# Patient Record
Sex: Male | Born: 1945 | Race: White | Hispanic: No | Smoking: Never smoker
Health system: Southern US, Community
[De-identification: ages and names within clinical notes are randomized; demographics above are authoritative.]

## PROBLEM LIST (undated history)

## (undated) DIAGNOSIS — I10 Essential (primary) hypertension: Secondary | ICD-10-CM

## (undated) DIAGNOSIS — E7800 Pure hypercholesterolemia, unspecified: Secondary | ICD-10-CM

## (undated) DIAGNOSIS — M109 Gout, unspecified: Secondary | ICD-10-CM

## (undated) DIAGNOSIS — G4730 Sleep apnea, unspecified: Secondary | ICD-10-CM

## (undated) HISTORY — PX: HERNIA REPAIR: SHX51

## (undated) HISTORY — PX: CYSTECTOMY: SUR359

---

## 2016-07-01 ENCOUNTER — Ambulatory Visit: Payer: Self-pay | Admitting: General Surgery

## 2016-07-01 NOTE — H&P (Signed)
History of Present Illness Axel Filler MD; 07/01/2016 10:17 AM) The patient is a 71 year old male who presents with an inguinal hernia. The patient is a 71 year old male comes in today with history of a right angle hernia. Patient states this is been there for the last several months. He does state that it is reducible. It is causing him some discomfort at times. He states that he is to be going out of the country in approximately a month will like to have this repaired before leaving. He's had no previous symptoms or signs of incarceration/strangulation.  Patient denies any fever, chills, nausea, vomiting, constipation. All other reviews systems are negative.   Past Surgical History Ferd Glassing, RN; 07/01/2016 9:50 AM) Lung Surgery  Left. Vasectomy   Diagnostic Studies History Ferd Glassing, RN; 07/01/2016 9:50 AM) Colonoscopy  within last year  Allergies Ferd Glassing, RN; 07/01/2016 9:49 AM) No Known Drug Allergies 07/01/2016  Medication History Ferd Glassing, RN; 07/01/2016 9:49 AM) Allopurinol (  Tablet, Oral) Active. Efudex (5% Cream, External) Active. Enalapril Maleate (  Tablet, Oral) Active. Fenofibrate (  Tablet, Oral) Active. Aspirin (  Tablet Chewable, Oral) Active. Medications Reconciled  Social History Ferd Glassing, RN; 07/01/2016 9:50 AM) Alcohol use  Occasional alcohol use. Caffeine use  Coffee, Tea. No drug use  Tobacco use  Never smoker.  Family History Ferd Glassing, RN; 07/01/2016 9:50 AM) Diabetes Mellitus  Father. Hypertension  Mother.  Other Problems Ferd Glassing, RN; 07/01/2016 9:50 AM) High blood pressure  Hypercholesterolemia  Inguinal Hernia     Review of Systems Ferd Glassing RN; 07/01/2016 9:50 AM) General Not Present- Appetite Loss, Chills, Fatigue, Fever, Night Sweats, Weight Gain and Weight Loss. Skin Not Present- Change in Wart/Mole, Dryness, Hives, Jaundice, New Lesions, Non-Healing Wounds, Rash and  Ulcer. HEENT Present- Wears glasses/contact lenses. Not Present- Earache, Hearing Loss, Hoarseness, Nose Bleed, Oral Ulcers, Ringing in the Ears, Seasonal Allergies, Sinus Pain, Sore Throat, Visual Disturbances and Yellow Eyes. Respiratory Not Present- Bloody sputum, Chronic Cough, Difficulty Breathing, Snoring and Wheezing. Cardiovascular Not Present- Chest Pain, Difficulty Breathing Lying Down, Leg Cramps, Palpitations, Rapid Heart Rate, Shortness of Breath and Swelling of Extremities. Gastrointestinal Not Present- Abdominal Pain, Bloating, Bloody Stool, Change in Bowel Habits, Chronic diarrhea, Constipation, Difficulty Swallowing, Excessive gas, Gets full quickly at meals, Hemorrhoids, Indigestion, Nausea, Rectal Pain and Vomiting. Male Genitourinary Not Present- Blood in Urine, Change in Urinary Stream, Frequency, Impotence, Nocturia, Painful Urination, Urgency and Urine Leakage. Musculoskeletal Not Present- Back Pain, Joint Pain, Joint Stiffness, Muscle Pain, Muscle Weakness and Swelling of Extremities. Neurological Not Present- Decreased Memory, Fainting, Headaches, Numbness, Seizures, Tingling, Tremor, Trouble walking and Weakness. Psychiatric Not Present- Anxiety, Bipolar, Change in Sleep Pattern, Depression, Fearful and Frequent crying. Hematology Present- Blood Thinners. Not Present- Easy Bruising, Excessive bleeding, Gland problems, HIV and Persistent Infections.  Vitals Ferd Glassing RN; 07/01/2016 9:49 AM) 07/01/2016 9:48 AM Weight: 261.2 lb Height: 71in Body Surface Area: 2.36 m Body Mass Index: 36.43 kg/m  Temp.: 98.21F  Pulse: 87 (Regular)  BP: 130/84 (Sitting, Left Arm, Standard)       Physical Exam Axel Filler, MD; 07/01/2016 10:17 AM) General Mental Status-Alert. General Appearance-Consistent with stated age. Hydration-Well hydrated. Voice-Normal.  Head and Neck Head-normocephalic, atraumatic with no lesions or palpable  masses. Trachea-midline.  Eye Eyeball - Bilateral-Extraocular movements intact. Sclera/Conjunctiva - Bilateral-No scleral icterus.  Chest and Lung Exam Chest and lung exam reveals -quiet, even and easy respiratory effort with no use of accessory muscles. Inspection Chest Wall - Normal. Back -  normal.  Cardiovascular Cardiovascular examination reveals -normal heart sounds, regular rate and rhythm with no murmurs.  Abdomen Inspection Skin - Scar - no surgical scars. Hernias - Inguinal hernia - Right - Reducible. Palpation/Percussion Normal exam - Soft, Non Tender, No Rebound tenderness, No Rigidity (guarding) and No hepatosplenomegaly. Auscultation Normal exam - Bowel sounds normal.  Neurologic Neurologic evaluation reveals -alert and oriented x 3 with no impairment of recent or remote memory. Mental Status-Normal.  Musculoskeletal Normal Exam - Left-Upper Extremity Strength Normal and Lower Extremity Strength Normal. Normal Exam - Right-Upper Extremity Strength Normal, Lower Extremity Weakness.    Assessment & Plan Axel Filler MD; 07/01/2016 10:17 AM) RIGHT INGUINAL HERNIA (K40.90) Impression: 70 year old male with likely direct right inguinal hernia 1. Will proceed to the operating for laparoscopic right inguinal hernia repair with mesh  All risks and benefits were discussed with the patient to generally include, but not limited to: infection, bleeding, damage to surrounding structures, acute and chronic nerve pain, and recurrence. Alternatives were offered and described. All questions were answered and the patient voiced understanding of the procedure and wishes to proceed at this point with hernia repair.

## 2016-07-03 NOTE — Pre-Procedure Instructions (Signed)
Sean Cabrera  07/03/2016      [id number] [place], [place] - [address] [place] [address] Phone: [phone] Fax: [phone]    Your procedure is scheduled on Wed, May 2 @ 8:30 AM  Report to [facility] Admitting at 6:30 AM  Call this number if you have problems the morning of surgery:  [phone]   Remember:  Do not eat food or drink liquids after midnight.  Take these medicines the morning of surgery with A SIP OF WATER Allopurinol(Zyloprim)             Stop taking your Aspirin. No Goody's,BC's,Aleve,Advil,Motrin,Ibuprofen,Fish Oil,or any Herbal Medications.    Do not wear jewelry.  Do not wear lotions, powders,colognes, or deoderant.             Men may shave face and neck.  Do not bring valuables to the hospital.  [facility] is not responsible for any belongings or valuables.  Contacts, dentures or bridgework may not be worn into surgery.  Leave your suitcase in the car.  After surgery it may be brought to your room.  For patients admitted to the hospital, discharge time will be determined by your treatment team.  Patients discharged the day of surgery will not be allowed to drive home.    Special instructions:   [facility] - Preparing for Surgery  Before surgery, you can play an important role.  Because skin is not sterile, your skin needs to be as free of germs as possible.  You can reduce the number of germs on you skin by washing with CHG (chlorahexidine gluconate) soap before surgery.  CHG is an antiseptic cleaner which kills germs and bonds with the skin to continue killing germs even after washing.  Please DO NOT use if you have an allergy to CHG or antibacterial soaps.  If your skin becomes reddened/irritated stop using the CHG and inform your nurse when you arrive at Short Stay.  Do not shave (including legs and underarms) for at least 48 hours prior to the first CHG shower.  You may shave your  face.  Please follow these instructions carefully:   1.  Shower with CHG Soap the night before surgery and the                                morning of Surgery.  2.  If you choose to wash your hair, wash your hair first as usual with your       normal shampoo.  3.  After you shampoo, rinse your hair and body thoroughly to remove the                      Shampoo.  4.  Use CHG as you would any other liquid soap.  You can apply chg directly       to the skin and wash gently with scrungie or a clean washcloth.  5.  Apply the CHG Soap to your body ONLY FROM THE NECK DOWN.        Do not use on open wounds or open sores.  Avoid contact with your eyes,       ears, mouth and genitals (private parts).  Wash genitals (private parts)       with your normal soap.  6.  Wash thoroughly, paying special attention to the area where  your surgery        will be performed.  7.  Thoroughly rinse your body with warm water from the neck down.  8.  DO NOT shower/wash with your normal soap after using and rinsing off       the CHG Soap.  9.  Pat yourself dry with a clean towel.            10.  Wear clean pajamas.            11.  Place clean sheets on your bed the night of your first shower and do not        sleep with pets.  Day of Surgery  Do not apply any lotions/deoderants the morning of surgery.  Please wear clean clothes to the hospital/surgery center.

## 2016-07-06 ENCOUNTER — Encounter (HOSPITAL_COMMUNITY)
Admission: RE | Admit: 2016-07-06 | Discharge: 2016-07-06 | Disposition: A | Discharge status: Home / Self Care | Payer: Medicare Other | Source: Ambulatory Visit | Attending: General Surgery | Admitting: General Surgery

## 2016-07-06 ENCOUNTER — Encounter (HOSPITAL_COMMUNITY): Payer: Self-pay

## 2016-07-06 DIAGNOSIS — Z9852 Vasectomy status: Secondary | ICD-10-CM | POA: Diagnosis not present

## 2016-07-06 DIAGNOSIS — Z8249 Family history of ischemic heart disease and other diseases of the circulatory system: Secondary | ICD-10-CM | POA: Diagnosis not present

## 2016-07-06 DIAGNOSIS — Z6836 Body mass index (BMI) 36.0-36.9, adult: Secondary | ICD-10-CM | POA: Diagnosis not present

## 2016-07-06 DIAGNOSIS — E669 Obesity, unspecified: Secondary | ICD-10-CM | POA: Diagnosis not present

## 2016-07-06 DIAGNOSIS — E7800 Pure hypercholesterolemia, unspecified: Secondary | ICD-10-CM | POA: Diagnosis not present

## 2016-07-06 DIAGNOSIS — Z7982 Long term (current) use of aspirin: Secondary | ICD-10-CM | POA: Diagnosis not present

## 2016-07-06 DIAGNOSIS — G4730 Sleep apnea, unspecified: Secondary | ICD-10-CM | POA: Diagnosis not present

## 2016-07-06 DIAGNOSIS — Z833 Family history of diabetes mellitus: Secondary | ICD-10-CM | POA: Diagnosis not present

## 2016-07-06 DIAGNOSIS — K4090 Unilateral inguinal hernia, without obstruction or gangrene, not specified as recurrent: Secondary | ICD-10-CM | POA: Diagnosis present

## 2016-07-06 DIAGNOSIS — I10 Essential (primary) hypertension: Secondary | ICD-10-CM | POA: Diagnosis not present

## 2016-07-06 DIAGNOSIS — Z9889 Other specified postprocedural states: Secondary | ICD-10-CM | POA: Diagnosis not present

## 2016-07-06 DIAGNOSIS — Z79899 Other long term (current) drug therapy: Secondary | ICD-10-CM | POA: Diagnosis not present

## 2016-07-06 HISTORY — DX: Sleep apnea, unspecified: G47.30

## 2016-07-06 HISTORY — DX: Gout, unspecified: M10.9

## 2016-07-06 HISTORY — DX: Essential (primary) hypertension: I10

## 2016-07-06 LAB — BASIC METABOLIC PANEL
Anion gap: 7 (ref 5–15)
BUN: 21 mg/dL — AB (ref 6–20)
CALCIUM: 9 mg/dL (ref 8.9–10.3)
CO2: 23 mmol/L (ref 22–32)
CREATININE: 1.21 mg/dL (ref 0.61–1.24)
Chloride: 108 mmol/L (ref 101–111)
GFR calc Af Amer: >60 mL/min (ref >60)
GFR, EST NON AFRICAN AMERICAN: 59 mL/min — AB (ref >60)
Glucose, Bld: 138 mg/dL — ABNORMAL HIGH (ref 65–99)
POTASSIUM: 3.9 mmol/L (ref 3.5–5.1)
SODIUM: 138 mmol/L (ref 135–145)

## 2016-07-06 LAB — CBC
HCT: 40.6 % (ref 39.0–52.0)
HEMOGLOBIN: 13.5 g/dL (ref 13.0–17.0)
MCH: 30.1 pg (ref 26.0–34.0)
MCHC: 33.3 g/dL (ref 30.0–36.0)
MCV: 90.4 fL (ref 78.0–100.0)
Platelets: 256 10*3/uL (ref 150–400)
RBC: 4.49 MIL/uL (ref 4.22–5.81)
RDW: 12.5 % (ref 11.5–15.5)
WBC: 5.8 10*3/uL (ref 4.0–10.5)

## 2016-07-06 NOTE — Progress Notes (Addendum)
REQUESTED STRESS TEST, OV, ANY CARDIAC TESTS FROM DR. ZACHARY IN [place], [place].  SPOKE WITH DR. Albertina Senegal OFFICE WHO STATED PATIENT WAS SEEN IN 2010 BUT STRESS TEST WAS TO BE RESCHEDULED AND THEY DO NOT SEE ONE DONE.  [phone]  ATTEMPTED TO CALL PCP DR. PAUL SETTLE X2 - UNABLE TO REACH. PHONE KEPT RINGING AND STATES MAILBOX IS FULL.  [phone]

## 2016-07-07 MED ORDER — DEXTROSE 5 % IV SOLN
3.0000 g | INTRAVENOUS | Status: AC
  Administered 2016-07-08: 3 g via INTRAVENOUS
  Filled 2016-07-07: qty 3000

## 2016-07-08 ENCOUNTER — Ambulatory Visit (HOSPITAL_COMMUNITY): Payer: Medicare Other | Admitting: Certified Registered Nurse Anesthetist

## 2016-07-08 ENCOUNTER — Ambulatory Visit (HOSPITAL_COMMUNITY)
Admission: RE | Admit: 2016-07-08 | Discharge: 2016-07-08 | Disposition: A | Discharge status: Home / Self Care | Payer: Medicare Other | Source: Ambulatory Visit | Attending: General Surgery | Admitting: General Surgery

## 2016-07-08 ENCOUNTER — Encounter (HOSPITAL_COMMUNITY)
Admission: RE | Disposition: A | Discharge status: Home / Self Care | Payer: Self-pay | Source: Ambulatory Visit | Attending: General Surgery

## 2016-07-08 DIAGNOSIS — K4090 Unilateral inguinal hernia, without obstruction or gangrene, not specified as recurrent: Secondary | ICD-10-CM | POA: Insufficient documentation

## 2016-07-08 DIAGNOSIS — I10 Essential (primary) hypertension: Secondary | ICD-10-CM | POA: Diagnosis not present

## 2016-07-08 DIAGNOSIS — Z8249 Family history of ischemic heart disease and other diseases of the circulatory system: Secondary | ICD-10-CM | POA: Insufficient documentation

## 2016-07-08 DIAGNOSIS — Z79899 Other long term (current) drug therapy: Secondary | ICD-10-CM | POA: Insufficient documentation

## 2016-07-08 DIAGNOSIS — E669 Obesity, unspecified: Secondary | ICD-10-CM | POA: Insufficient documentation

## 2016-07-08 DIAGNOSIS — G4730 Sleep apnea, unspecified: Secondary | ICD-10-CM | POA: Diagnosis not present

## 2016-07-08 DIAGNOSIS — Z9852 Vasectomy status: Secondary | ICD-10-CM | POA: Insufficient documentation

## 2016-07-08 DIAGNOSIS — Z833 Family history of diabetes mellitus: Secondary | ICD-10-CM | POA: Insufficient documentation

## 2016-07-08 DIAGNOSIS — Z7982 Long term (current) use of aspirin: Secondary | ICD-10-CM | POA: Insufficient documentation

## 2016-07-08 DIAGNOSIS — E7800 Pure hypercholesterolemia, unspecified: Secondary | ICD-10-CM | POA: Diagnosis not present

## 2016-07-08 DIAGNOSIS — Z6836 Body mass index (BMI) 36.0-36.9, adult: Secondary | ICD-10-CM | POA: Insufficient documentation

## 2016-07-08 DIAGNOSIS — Z9889 Other specified postprocedural states: Secondary | ICD-10-CM | POA: Insufficient documentation

## 2016-07-08 HISTORY — PX: INSERTION OF MESH: SHX5868

## 2016-07-08 HISTORY — PX: INGUINAL HERNIA REPAIR: SHX194

## 2016-07-08 SURGERY — REPAIR, HERNIA, INGUINAL, LAPAROSCOPIC
Anesthesia: General | Site: Inguinal | Laterality: Right

## 2016-07-08 MED ORDER — MIDAZOLAM HCL 2 MG/2ML IJ SOLN
INTRAMUSCULAR | Status: AC
  Filled 2016-07-08: qty 2

## 2016-07-08 MED ORDER — BUPIVACAINE HCL 0.25 % IJ SOLN
INTRAMUSCULAR | Status: DC | PRN
  Administered 2016-07-08: 4 mL

## 2016-07-08 MED ORDER — OXYCODONE HCL 5 MG PO TABS
ORAL_TABLET | ORAL | Status: AC
  Filled 2016-07-08: qty 2

## 2016-07-08 MED ORDER — PROPOFOL 10 MG/ML IV BOLUS
INTRAVENOUS | Status: AC
  Filled 2016-07-08: qty 20

## 2016-07-08 MED ORDER — ACETAMINOPHEN 650 MG RE SUPP: 650.0000 mg | RECTAL | Status: DC | PRN

## 2016-07-08 MED ORDER — ONDANSETRON HCL 4 MG/2ML IJ SOLN
INTRAMUSCULAR | Status: DC | PRN
  Administered 2016-07-08: 4 mg via INTRAVENOUS

## 2016-07-08 MED ORDER — MORPHINE SULFATE (PF) 2 MG/ML IV SOLN: 2.0000 mg | INTRAVENOUS | Status: DC | PRN

## 2016-07-08 MED ORDER — ONDANSETRON HCL 4 MG/2ML IJ SOLN: 4.0000 mg | Freq: Once | INTRAMUSCULAR | Status: DC | PRN

## 2016-07-08 MED ORDER — OXYCODONE-ACETAMINOPHEN 5-325 MG PO TABS: 1.0000 | ORAL_TABLET | ORAL | 0 refills | Status: DC | PRN

## 2016-07-08 MED ORDER — LIDOCAINE HCL (CARDIAC) 20 MG/ML IV SOLN
INTRAVENOUS | Status: DC | PRN
  Administered 2016-07-08: 100 mg via INTRAVENOUS

## 2016-07-08 MED ORDER — PROPOFOL 10 MG/ML IV BOLUS
INTRAVENOUS | Status: DC | PRN
  Administered 2016-07-08: 150 mg via INTRAVENOUS

## 2016-07-08 MED ORDER — LACTATED RINGERS IV SOLN
INTRAVENOUS | Status: DC | PRN
  Administered 2016-07-08: 08:00:00 via INTRAVENOUS

## 2016-07-08 MED ORDER — FENTANYL CITRATE (PF) 100 MCG/2ML IJ SOLN
25.0000 ug | INTRAMUSCULAR | Status: DC | PRN
  Administered 2016-07-08 (×2): 50 ug via INTRAVENOUS

## 2016-07-08 MED ORDER — SODIUM CHLORIDE 0.9% FLUSH: 3.0000 mL | Freq: Two times a day (BID) | INTRAVENOUS | Status: DC

## 2016-07-08 MED ORDER — FENTANYL CITRATE (PF) 100 MCG/2ML IJ SOLN
INTRAMUSCULAR | Status: AC
  Administered 2016-07-08: 50 ug via INTRAVENOUS
  Filled 2016-07-08: qty 2

## 2016-07-08 MED ORDER — CHLORHEXIDINE GLUCONATE CLOTH 2 % EX PADS: 6.0000 | MEDICATED_PAD | Freq: Once | CUTANEOUS | Status: DC

## 2016-07-08 MED ORDER — ACETAMINOPHEN 325 MG PO TABS
ORAL_TABLET | ORAL | Status: DC
Start: 2016-07-08 — End: 2016-07-08
  Filled 2016-07-08: qty 2

## 2016-07-08 MED ORDER — SUGAMMADEX SODIUM 500 MG/5ML IV SOLN
INTRAVENOUS | Status: DC | PRN
  Administered 2016-07-08: 250 mg via INTRAVENOUS

## 2016-07-08 MED ORDER — 0.9 % SODIUM CHLORIDE (POUR BTL) OPTIME
TOPICAL | Status: DC | PRN
  Administered 2016-07-08: 1000 mL

## 2016-07-08 MED ORDER — FENTANYL CITRATE (PF) 250 MCG/5ML IJ SOLN
INTRAMUSCULAR | Status: AC
  Filled 2016-07-08: qty 5

## 2016-07-08 MED ORDER — ACETAMINOPHEN 325 MG PO TABS
650.0000 mg | ORAL_TABLET | ORAL | Status: DC | PRN
  Administered 2016-07-08: 650 mg via ORAL

## 2016-07-08 MED ORDER — SODIUM CHLORIDE 0.9 % IV SOLN: 250.0000 mL | INTRAVENOUS | Status: DC | PRN

## 2016-07-08 MED ORDER — BUPIVACAINE HCL (PF) 0.25 % IJ SOLN
INTRAMUSCULAR | Status: AC
  Filled 2016-07-08: qty 30

## 2016-07-08 MED ORDER — OXYCODONE HCL 5 MG PO TABS
5.0000 mg | ORAL_TABLET | ORAL | Status: DC | PRN
  Administered 2016-07-08: 10 mg via ORAL

## 2016-07-08 MED ORDER — FENTANYL CITRATE (PF) 100 MCG/2ML IJ SOLN
INTRAMUSCULAR | Status: DC | PRN
  Administered 2016-07-08 (×4): 50 ug via INTRAVENOUS
  Administered 2016-07-08: 100 ug via INTRAVENOUS
  Administered 2016-07-08: 50 ug via INTRAVENOUS

## 2016-07-08 MED ORDER — ROCURONIUM BROMIDE 100 MG/10ML IV SOLN
INTRAVENOUS | Status: DC | PRN
  Administered 2016-07-08: 50 mg via INTRAVENOUS
  Administered 2016-07-08: 10 mg via INTRAVENOUS

## 2016-07-08 MED ORDER — SODIUM CHLORIDE 0.9% FLUSH: 3.0000 mL | INTRAVENOUS | Status: DC | PRN

## 2016-07-08 SURGICAL SUPPLY — 41 items
APPLIER CLIP 5 13 M/L LIGAMAX5 (MISCELLANEOUS)
BENZOIN TINCTURE PRP APPL 2/3 (GAUZE/BANDAGES/DRESSINGS) ×3 IMPLANT
CANISTER SUCT 3000ML PPV (MISCELLANEOUS) IMPLANT
CHLORAPREP W/TINT 26ML (MISCELLANEOUS) ×3 IMPLANT
CLIP APPLIE 5 13 M/L LIGAMAX5 (MISCELLANEOUS) IMPLANT
CLOSURE WOUND 1/2 X4 (GAUZE/BANDAGES/DRESSINGS) ×1
COVER SURGICAL LIGHT HANDLE (MISCELLANEOUS) ×3 IMPLANT
ENDOLOOP SUT PDS II  0 18 (SUTURE) ×2
ENDOLOOP SUT PDS II 0 18 (SUTURE) ×1 IMPLANT
GAUZE SPONGE 2X2 8PLY STRL LF (GAUZE/BANDAGES/DRESSINGS) ×1 IMPLANT
GLOVE BIO SURGEON STRL SZ7.5 (GLOVE) ×3 IMPLANT
GLOVE BIOGEL PI IND STRL 8 (GLOVE) ×1 IMPLANT
GLOVE BIOGEL PI INDICATOR 8 (GLOVE) ×2
GLOVE SURG SS PI 8.0 STRL IVOR (GLOVE) ×6 IMPLANT
GOWN STRL REUS W/ TWL LRG LVL3 (GOWN DISPOSABLE) ×2 IMPLANT
GOWN STRL REUS W/ TWL XL LVL3 (GOWN DISPOSABLE) ×1 IMPLANT
GOWN STRL REUS W/TWL LRG LVL3 (GOWN DISPOSABLE) ×4
GOWN STRL REUS W/TWL XL LVL3 (GOWN DISPOSABLE) ×2
KIT BASIN OR (CUSTOM PROCEDURE TRAY) ×3 IMPLANT
KIT ROOM TURNOVER OR (KITS) ×3 IMPLANT
MESH 3DMAX 5X7 RT XLRG (Mesh General) ×3 IMPLANT
NEEDLE INSUFFLATION 14GA 120MM (NEEDLE) ×3 IMPLANT
NS IRRIG 1000ML POUR BTL (IV SOLUTION) IMPLANT
PAD ARMBOARD 7.5X6 YLW CONV (MISCELLANEOUS) ×6 IMPLANT
RELOAD STAPLE HERNIA 4.0 BLUE (INSTRUMENTS) ×3 IMPLANT
RELOAD STAPLE HERNIA 4.8 BLK (STAPLE) ×3 IMPLANT
SCISSORS LAP 5X35 DISP (ENDOMECHANICALS) ×3 IMPLANT
SET TROCAR LAP APPLE-HUNT 5MM (ENDOMECHANICALS) ×3 IMPLANT
SPONGE GAUZE 2X2 STER 10/PKG (GAUZE/BANDAGES/DRESSINGS) ×2
STAPLER HERNIA 12 8.5 360D (INSTRUMENTS) ×3 IMPLANT
STRIP CLOSURE SKIN 1/2X4 (GAUZE/BANDAGES/DRESSINGS) ×2 IMPLANT
SUT MNCRL AB 4-0 PS2 18 (SUTURE) ×3 IMPLANT
SUT VIC AB 1 CT1 27 (SUTURE)
SUT VIC AB 1 CT1 27XBRD ANBCTR (SUTURE) IMPLANT
SYRINGE TOOMEY DISP (SYRINGE) ×3 IMPLANT
TOWEL OR 17X24 6PK STRL BLUE (TOWEL DISPOSABLE) ×3 IMPLANT
TRAY FOLEY CATH SILVER 14FR (SET/KITS/TRAYS/PACK) ×3 IMPLANT
TRAY LAPAROSCOPIC MC (CUSTOM PROCEDURE TRAY) ×3 IMPLANT
TROCAR XCEL 12X100 BLDLESS (ENDOMECHANICALS) ×3 IMPLANT
TUBING INSUFFLATION (TUBING) ×3 IMPLANT
WATER STERILE IRR 1000ML POUR (IV SOLUTION) IMPLANT

## 2016-07-08 NOTE — Anesthesia Procedure Notes (Signed)
Procedure Name: Intubation Date/Time: 07/08/2016 8:40 AM Performed by: Shirlyn Goltz Pre-anesthesia Checklist: Patient identified, Emergency Drugs available, Suction available and Patient being monitored Patient Re-evaluated:Patient Re-evaluated prior to inductionOxygen Delivery Method: Circle system utilized Preoxygenation: Pre-oxygenation with 100% oxygen Intubation Type: IV induction Ventilation: Mask ventilation without difficulty Laryngoscope Size: Mac and 4 Grade View: Grade III Tube type: Oral Tube size: 7.0 mm Number of attempts: 2 (x1 with MAC3, x1 with MAC4) Airway Equipment and Method: Stylet Placement Confirmation: ETT inserted through vocal cords under direct vision,  positive ETCO2 and breath sounds checked- equal and bilateral Secured at: 24 cm Tube secured with: Tape Dental Injury: Teeth and Oropharynx as per pre-operative assessment

## 2016-07-08 NOTE — Interval H&P Note (Signed)
History and Physical Interval Note:  07/08/2016 7:41 AM  Sean Cabrera  has presented today for surgery, with the diagnosis of Right inguinal hernia  The various methods of treatment have been discussed with the patient and family. After consideration of risks, benefits and other options for treatment, the patient has consented to  Procedure(s): LAPAROSCOPIC RIGHT INGUINAL HERNIA REPAIR WITH MESH (Right) INSERTION OF MESH (Right) as a surgical intervention .  The patient's history has been reviewed, patient examined, no change in status, stable for surgery.  I have reviewed the patient's chart and labs.  Questions were answered to the patient's satisfaction.     Marigene Ehlers., Jed Limerick

## 2016-07-08 NOTE — Anesthesia Preprocedure Evaluation (Addendum)
Anesthesia Evaluation  Patient identified by MRN, date of birth, ID band Patient awake    Reviewed: Allergy & Precautions, NPO status , Patient's Chart, lab work & pertinent test results  Airway Mallampati: III  TM Distance: >3 FB Neck ROM: Full    Dental  (+) Teeth Intact, Dental Advisory Given   Pulmonary sleep apnea ,    Pulmonary exam normal breath sounds clear to auscultation       Cardiovascular hypertension, Pt. on medications (-) angina(-) CAD, (-) Past MI and (-) CHF Normal cardiovascular exam Rhythm:Regular Rate:Normal     Neuro/Psych negative neurological ROS     GI/Hepatic negative GI ROS, Neg liver ROS,   Endo/Other  Obesity   Renal/GU negative Renal ROS     Musculoskeletal negative musculoskeletal ROS (+)   Abdominal   Peds  Hematology negative hematology ROS (+)   Anesthesia Other Findings Day of surgery medications reviewed with the patient.  Reproductive/Obstetrics                            Anesthesia Physical Anesthesia Plan  ASA: II  Anesthesia Plan: General   Post-op Pain Management:    Induction: Intravenous  Airway Management Planned: Oral ETT  Additional Equipment:   Intra-op Plan:   Post-operative Plan: Extubation in OR  Informed Consent: I have reviewed the patients History and Physical, chart, labs and discussed the procedure including the risks, benefits and alternatives for the proposed anesthesia with the patient or authorized representative who has indicated his/her understanding and acceptance.   Dental advisory given  Plan Discussed with: CRNA  Anesthesia Plan Comments: (Risks/benefits of general anesthesia discussed with patient including risk of damage to teeth, lips, gum, and tongue, nausea/vomiting, allergic reactions to medications, and the possibility of heart attack, stroke and death.  All patient questions answered.  Patient wishes to  proceed.)        Anesthesia Quick Evaluation

## 2016-07-08 NOTE — Anesthesia Postprocedure Evaluation (Signed)
Anesthesia Post Note  Patient: Sean Cabrera  Procedure(s) Performed: Procedure(s) (LRB): LAPAROSCOPIC RIGHT INGUINAL HERNIA REPAIR WITH MESH (Right) INSERTION OF MESH (Right)  Patient location during evaluation: PACU Anesthesia Type: General Level of consciousness: awake and alert Pain management: pain level controlled Vital Signs Assessment: post-procedure vital signs reviewed and stable Respiratory status: spontaneous breathing, nonlabored ventilation, respiratory function stable and patient connected to nasal cannula oxygen Cardiovascular status: blood pressure returned to baseline and stable Postop Assessment: no signs of nausea or vomiting Anesthetic complications: no       Last Vitals:  Vitals:   07/08/16 1015 07/08/16 1030  BP: 138/80 126/79  Pulse: 76 78  Resp: 14 17  Temp:      Last Pain:  Vitals:   07/08/16 0643  TempSrc: Oral                 Cecile Hearing

## 2016-07-08 NOTE — Transfer of Care (Signed)
Immediate Anesthesia Transfer of Care Note  Patient: Sean Cabrera  Procedure(s) Performed: Procedure(s): LAPAROSCOPIC RIGHT INGUINAL HERNIA REPAIR WITH MESH (Right) INSERTION OF MESH (Right)  Patient Location: PACU  Anesthesia Type:General  Level of Consciousness: awake, alert , oriented and patient cooperative  Airway & Oxygen Therapy: Patient Spontanous Breathing and Patient connected to face mask oxygen  Post-op Assessment: Report given to RN and Post -op Vital signs reviewed and stable  Post vital signs: Reviewed and stable  Last Vitals:  Vitals:   07/08/16 0643  BP: 139/69  Pulse: 76  Resp: 20  Temp: 36.8 C    Last Pain:  Vitals:   07/08/16 0643  TempSrc: Oral         Complications: No apparent anesthesia complications

## 2016-07-08 NOTE — Op Note (Signed)
07/08/2016  9:32 AM  PATIENT:  Sean Cabrera  71 y.o. male  PRE-OPERATIVE DIAGNOSIS:  Right inguinal hernia  POST-OPERATIVE DIAGNOSIS:  Large Right DIRECT inguinal hernia  PROCEDURE:  Procedure(s): LAPAROSCOPIC RIGHT INGUINAL HERNIA REPAIR WITH MESH (Right) INSERTION OF MESH (Right)  SURGEON:  Surgeon(s) and Role:    * Axel Filler, MD - Primary  ANESTHESIA:   local and general  EBL:  5cc  BLOOD ADMINISTERED:none  DRAINS: none   LOCAL MEDICATIONS USED:  BUPIVICAINE   SPECIMEN:  No Specimen  DISPOSITION OF SPECIMEN:  N/A  COUNTS:  YES  TOURNIQUET:  * No tourniquets in log *  DICTATION: .Dragon Dictation   Counts: reported as correct x 2  Findings:  The patient had a large right direct hernia  Indications for procedure:  The patient is a 68 -year-old male with a right hernia for several months. Patient complained of symptomatology to his right inguinal area. The patient was taken back for elective inguinal hernia repair.  Details of the procedure: The patient was taken back to the operating room. The patient was placed in supine position with bilateral SCDs in place.  The patient was prepped and draped in the usual sterile fashion.  After appropriate anitbiotics were confirmed, a time-out was confirmed and all facts were verified.  0.25% Marcaine was used to infiltrate the umbilical area. A 11-blade was used to cut down the skin and blunt dissection was used to get the anterior fashion.  The anterior fascia was incised approximately 1 cm and the muscles were retracted laterally. Blunt dissection was then used to create a space in the preperitoneal area. At this time a 10 mm camera was then introduced into the space and advanced the pubic tubercle and a 12 mm trocar was placed over this and insufflation was started.  At this time and space was created from medial to laterally the preperitoneal space.  Cooper's ligament was initially cleaned off.  The hernia sac was  identified in the direct space. Dissection of the incarcerated fat was undertaken and the transversalis fascia retracted spontaneously.  At this time the spermatic cord was dissected away from the surrounding tissues.  There was no indirect component.  The peritoneuam was dissected back. There was a small tear into the hernia sac, this was ligated with a ENdoloop. The transversalis fascia was  Tacked to Cooper's ligament with 4.8 staples.  Once the peritoneum was taken down to approximately the umbilicus a Bard 3D Max mesh, size: Barney Drain, was  introduced into the preperitoneal space.  The mesh was brought over to cover the direct and indirect hernia spaces.  This was anchored into place and secured to Cooper's ligament with 4.77mm staples from a Coviden hernia stapler. It was anchored to the anterior abdominal wall with 4.8 mm staples. The hernia sac was seen lying posterior to the mesh. There was no staples placed laterally. The insufflation was evacuated and the peritoneum was seen posterior to the mesh. The trochars were removed. The anterior fascia was reapproximated using #1 Vicryl on a UR- 6.  Intra-abdominal air was evacuated and the Veress needle removed. The skin was reapproximated using 4-0 Monocryl subcuticular fashion the patient was awakened from general anesthesia and taken to recovery in stable condition.   PLAN OF CARE: Discharge to home after PACU  PATIENT DISPOSITION:  PACU - hemodynamically stable.   Delay start of Pharmacological VTE agent (>24hrs) due to surgical blood loss or risk of bleeding: not applicable

## 2016-07-08 NOTE — H&P (View-Only) (Signed)
History of Present Illness Axel Filler MD; 07/01/2016 10:17 AM) The patient is a 71 year old male who presents with an inguinal hernia. The patient is a 71 year old male comes in today with history of a right angle hernia. Patient states this is been there for the last several months. He does state that it is reducible. It is causing him some discomfort at times. He states that he is to be going out of the country in approximately a month will like to have this repaired before leaving. He's had no previous symptoms or signs of incarceration/strangulation.  Patient denies any fever, chills, nausea, vomiting, constipation. All other reviews systems are negative.   Past Surgical History Ferd Glassing, RN; 07/01/2016 9:50 AM) Lung Surgery  Left. Vasectomy   Diagnostic Studies History Ferd Glassing, RN; 07/01/2016 9:50 AM) Colonoscopy  within last year  Allergies Ferd Glassing, RN; 07/01/2016 9:49 AM) No Known Drug Allergies 07/01/2016  Medication History Ferd Glassing, RN; 07/01/2016 9:49 AM) Allopurinol (  Tablet, Oral) Active. Efudex (5% Cream, External) Active. Enalapril Maleate (  Tablet, Oral) Active. Fenofibrate (  Tablet, Oral) Active. Aspirin (  Tablet Chewable, Oral) Active. Medications Reconciled  Social History Ferd Glassing, RN; 07/01/2016 9:50 AM) Alcohol use  Occasional alcohol use. Caffeine use  Coffee, Tea. No drug use  Tobacco use  Never smoker.  Family History Ferd Glassing, RN; 07/01/2016 9:50 AM) Diabetes Mellitus  Father. Hypertension  Mother.  Other Problems Ferd Glassing, RN; 07/01/2016 9:50 AM) High blood pressure  Hypercholesterolemia  Inguinal Hernia     Review of Systems Ferd Glassing RN; 07/01/2016 9:50 AM) General Not Present- Appetite Loss, Chills, Fatigue, Fever, Night Sweats, Weight Gain and Weight Loss. Skin Not Present- Change in Wart/Mole, Dryness, Hives, Jaundice, New Lesions, Non-Healing Wounds, Rash and  Ulcer. HEENT Present- Wears glasses/contact lenses. Not Present- Earache, Hearing Loss, Hoarseness, Nose Bleed, Oral Ulcers, Ringing in the Ears, Seasonal Allergies, Sinus Pain, Sore Throat, Visual Disturbances and Yellow Eyes. Respiratory Not Present- Bloody sputum, Chronic Cough, Difficulty Breathing, Snoring and Wheezing. Cardiovascular Not Present- Chest Pain, Difficulty Breathing Lying Down, Leg Cramps, Palpitations, Rapid Heart Rate, Shortness of Breath and Swelling of Extremities. Gastrointestinal Not Present- Abdominal Pain, Bloating, Bloody Stool, Change in Bowel Habits, Chronic diarrhea, Constipation, Difficulty Swallowing, Excessive gas, Gets full quickly at meals, Hemorrhoids, Indigestion, Nausea, Rectal Pain and Vomiting. Male Genitourinary Not Present- Blood in Urine, Change in Urinary Stream, Frequency, Impotence, Nocturia, Painful Urination, Urgency and Urine Leakage. Musculoskeletal Not Present- Back Pain, Joint Pain, Joint Stiffness, Muscle Pain, Muscle Weakness and Swelling of Extremities. Neurological Not Present- Decreased Memory, Fainting, Headaches, Numbness, Seizures, Tingling, Tremor, Trouble walking and Weakness. Psychiatric Not Present- Anxiety, Bipolar, Change in Sleep Pattern, Depression, Fearful and Frequent crying. Hematology Present- Blood Thinners. Not Present- Easy Bruising, Excessive bleeding, Gland problems, HIV and Persistent Infections.  Vitals Ferd Glassing RN; 07/01/2016 9:49 AM) 07/01/2016 9:48 AM Weight: 261.2 lb Height: 71in Body Surface Area: 2.36 m Body Mass Index: 36.43 kg/m  Temp.: 98.21F  Pulse: 87 (Regular)  BP: 130/84 (Sitting, Left Arm, Standard)       Physical Exam Axel Filler, MD; 07/01/2016 10:17 AM) General Mental Status-Alert. General Appearance-Consistent with stated age. Hydration-Well hydrated. Voice-Normal.  Head and Neck Head-normocephalic, atraumatic with no lesions or palpable  masses. Trachea-midline.  Eye Eyeball - Bilateral-Extraocular movements intact. Sclera/Conjunctiva - Bilateral-No scleral icterus.  Chest and Lung Exam Chest and lung exam reveals -quiet, even and easy respiratory effort with no use of accessory muscles. Inspection Chest Wall - Normal. Back -  normal.  Cardiovascular Cardiovascular examination reveals -normal heart sounds, regular rate and rhythm with no murmurs.  Abdomen Inspection Skin - Scar - no surgical scars. Hernias - Inguinal hernia - Right - Reducible. Palpation/Percussion Normal exam - Soft, Non Tender, No Rebound tenderness, No Rigidity (guarding) and No hepatosplenomegaly. Auscultation Normal exam - Bowel sounds normal.  Neurologic Neurologic evaluation reveals -alert and oriented x 3 with no impairment of recent or remote memory. Mental Status-Normal.  Musculoskeletal Normal Exam - Left-Upper Extremity Strength Normal and Lower Extremity Strength Normal. Normal Exam - Right-Upper Extremity Strength Normal, Lower Extremity Weakness.    Assessment & Plan (Renato Spellman MD; 07/01/2016 10:17 AM) RIGHT INGUINAL HERNIA (K40.90) Impression: 71-year-old male with likely direct right inguinal hernia 1. Will proceed to the operating for laparoscopic right inguinal hernia repair with mesh  All risks and benefits were discussed with the patient to generally include, but not limited to: infection, bleeding, damage to surrounding structures, acute and chronic nerve pain, and recurrence. Alternatives were offered and described. All questions were answered and the patient voiced understanding of the procedure and wishes to proceed at this point with hernia repair. 

## 2016-07-08 NOTE — Discharge Instructions (Signed)
[facility] _______[facility] °INGUINAL HERNIA REPAIR: POST OP INSTRUCTIONS ° °Always review your discharge instruction sheet given to you by the facility where your surgery was performed. °IF YOU HAVE DISABILITY OR FAMILY LEAVE FORMS, YOU MUST BRING THEM TO THE OFFICE FOR PROCESSING.   °DO NOT GIVE THEM TO YOUR DOCTOR. ° °1. A  prescription for pain medication may be given to you upon discharge.  Take your pain medication as prescribed, if needed.  If narcotic pain medicine is not needed, then you may take acetaminophen (Tylenol) or ibuprofen (Advil) as needed. °2. Take your usually prescribed medications unless otherwise directed. °If you need a refill on your pain medication, please contact your pharmacy.  They will contact our office to request authorization. Prescriptions will not be filled after 5 pm or on week-ends. °3. You should follow a light diet the first 24 hours after arrival home, such as soup and crackers, etc.  Be sure to include lots of fluids daily.  Resume your normal diet the day after surgery. °4.Most patients will experience some swelling and bruising around the umbilicus or in the groin and scrotum.  Ice packs and reclining will help.  Swelling and bruising can take several days to resolve.  °6. It is common to experience some constipation if taking pain medication after surgery.  Increasing fluid intake and taking a stool softener (such as Colace) will usually help or prevent this problem from occurring.  A mild laxative (Milk of Magnesia or Miralax) should be taken according to package directions if there are no bowel movements after 48 hours. °7. Unless discharge instructions indicate otherwise, you may remove your bandages 24-48 hours after surgery, and you may shower at that time.  You may have steri-strips (small skin tapes) in place directly over the incision.  These strips should be left on the skin for 7-10 days.  If your surgeon used skin glue on the incision, you may  shower in 24 hours.  The glue will flake off over the next 2-3 weeks.  Any sutures or staples will be removed at the office during your follow-up visit. °8. ACTIVITIES:  You may resume regular (light) daily activities beginning the next day--such as daily self-care, walking, climbing stairs--gradually increasing activities as tolerated.  You may have sexual intercourse when it is comfortable.  Refrain from any heavy lifting or straining until approved by your doctor. ° °a.You may drive when you are no longer taking prescription pain medication, you can comfortably wear a seatbelt, and you can safely maneuver your car and apply brakes. °b.RETURN TO WORK:   °_____________________________________________ ° °9.You should see your doctor in the office for a follow-up appointment approximately 2-3 weeks after your surgery.  Make sure that you call for this appointment within a day or two after you arrive home to insure a convenient appointment time. °10.OTHER INSTRUCTIONS: _________________________ °   _____________________________________ ° °WHEN TO CALL YOUR DOCTOR: °1. Fever over 101.0 °2. Inability to urinate °3. Nausea and/or vomiting °4. Extreme swelling or bruising °5. Continued bleeding from incision. °6. Increased pain, redness, or drainage from the incision ° °The clinic staff is available to answer your questions during regular business hours.  Please don’t hesitate to call and ask to speak to one of the nurses for clinical concerns.  If you have a medical emergency, go to the nearest emergency room or call 911.  A surgeon from [facility] is always on call at the hospital ° ° °[address], [address], [place], [place]  [address] ? ° [address] [address], [place], [place]   [address] °[phone] ? [phone] ? FAX [phone] °Web site: [email or web address] ° °

## 2016-07-09 ENCOUNTER — Encounter (HOSPITAL_COMMUNITY): Payer: Self-pay | Admitting: General Surgery

## 2016-12-07 LAB — HM COLONOSCOPY

## 2017-01-21 ENCOUNTER — Other Ambulatory Visit: Payer: Self-pay | Admitting: General Surgery

## 2017-01-21 DIAGNOSIS — K4090 Unilateral inguinal hernia, without obstruction or gangrene, not specified as recurrent: Secondary | ICD-10-CM

## 2017-01-26 ENCOUNTER — Ambulatory Visit
Admission: RE | Admit: 2017-01-26 | Discharge: 2017-01-26 | Disposition: A | Discharge status: Home / Self Care | Payer: Medicare Other | Source: Ambulatory Visit | Attending: General Surgery | Admitting: General Surgery

## 2017-01-26 DIAGNOSIS — K4090 Unilateral inguinal hernia, without obstruction or gangrene, not specified as recurrent: Secondary | ICD-10-CM

## 2018-11-09 DIAGNOSIS — Z85828 Personal history of other malignant neoplasm of skin: Secondary | ICD-10-CM | POA: Insufficient documentation

## 2019-01-05 IMAGING — US US PELVIS LIMITED
1 series · 5 of 5 positions shown · non-contrast
Comparison: None in PACs

CLINICAL DATA: Right inguinal and leg pain for the past 2 months.
History of right inguinal hernia surgical in July 2016.

EXAM:
LIMITED ULTRASOUND OF PELVIS
TECHNIQUE: Limited transabdominal ultrasound examination of the pelvis was
performed.

[Series 1: us pelvis limited · 0.11mm/px · 5 of 5 slices shown]
[im 1/5]
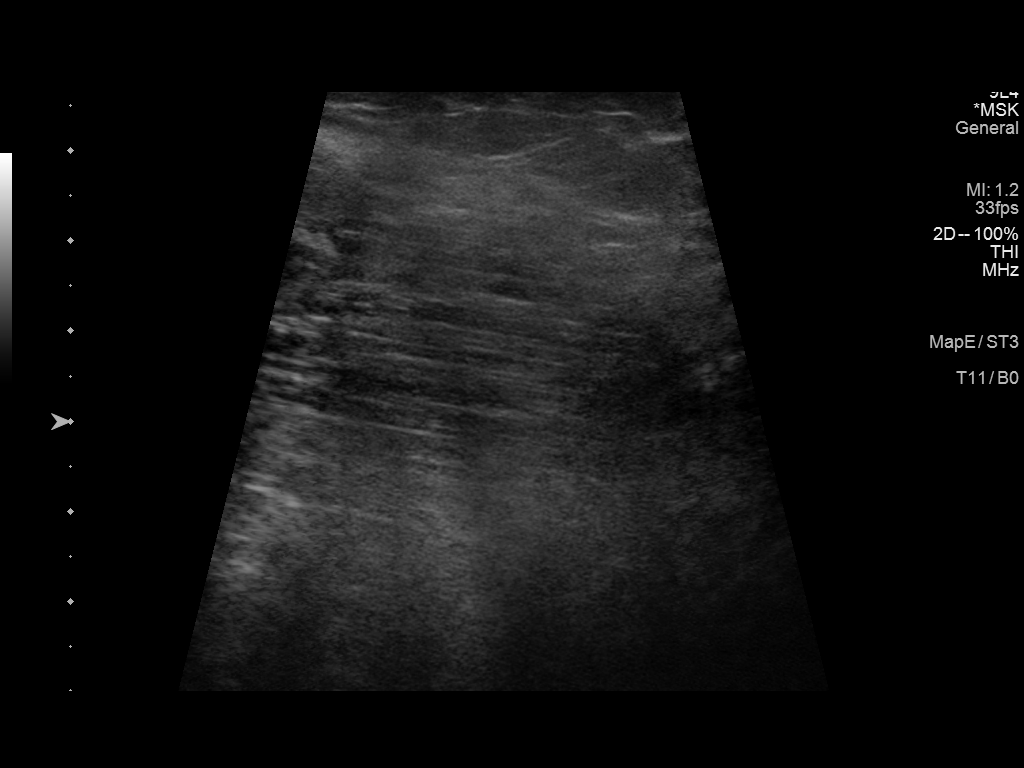
[im 2/5]
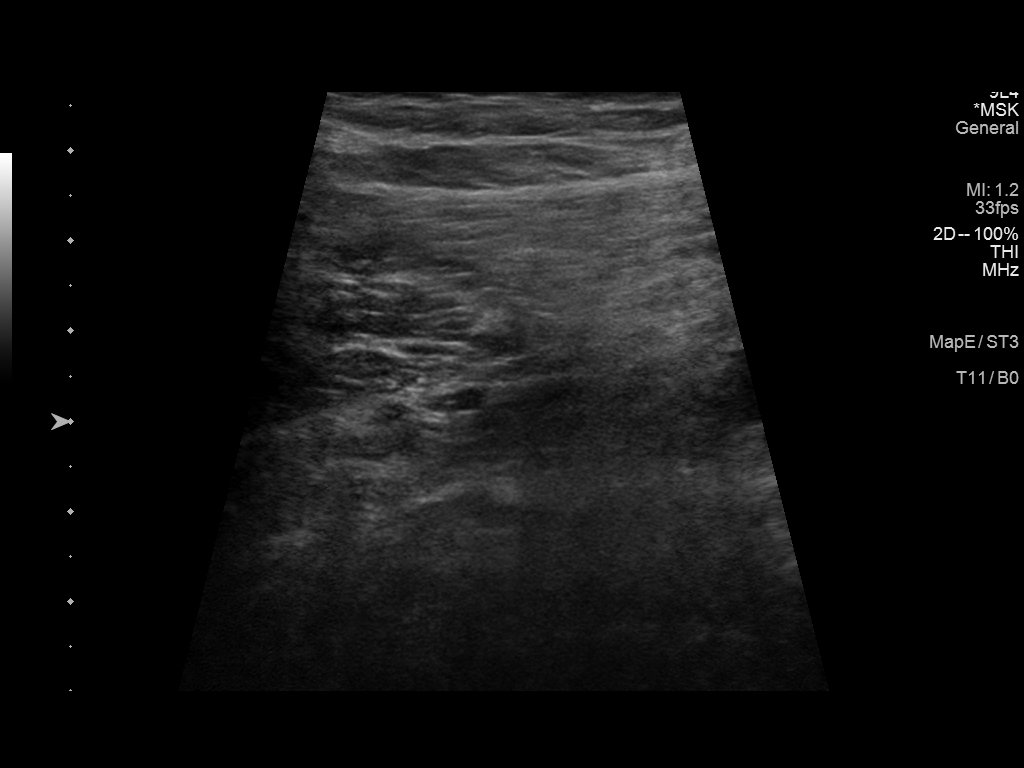
[im 3/5]
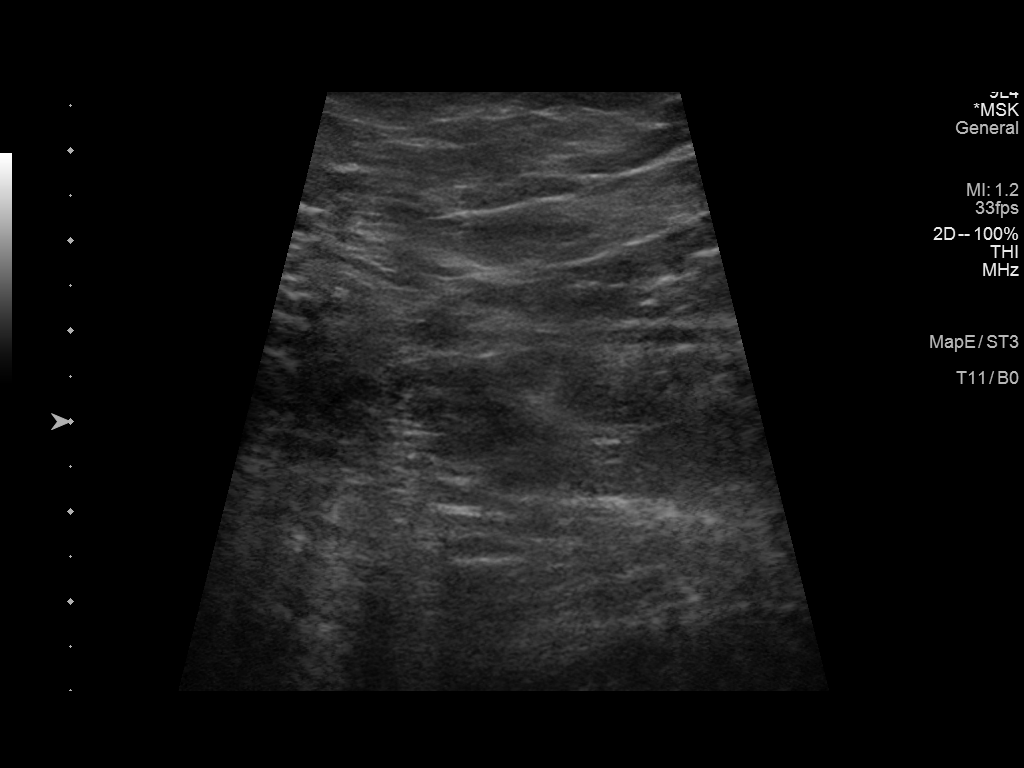
[im 4/5]
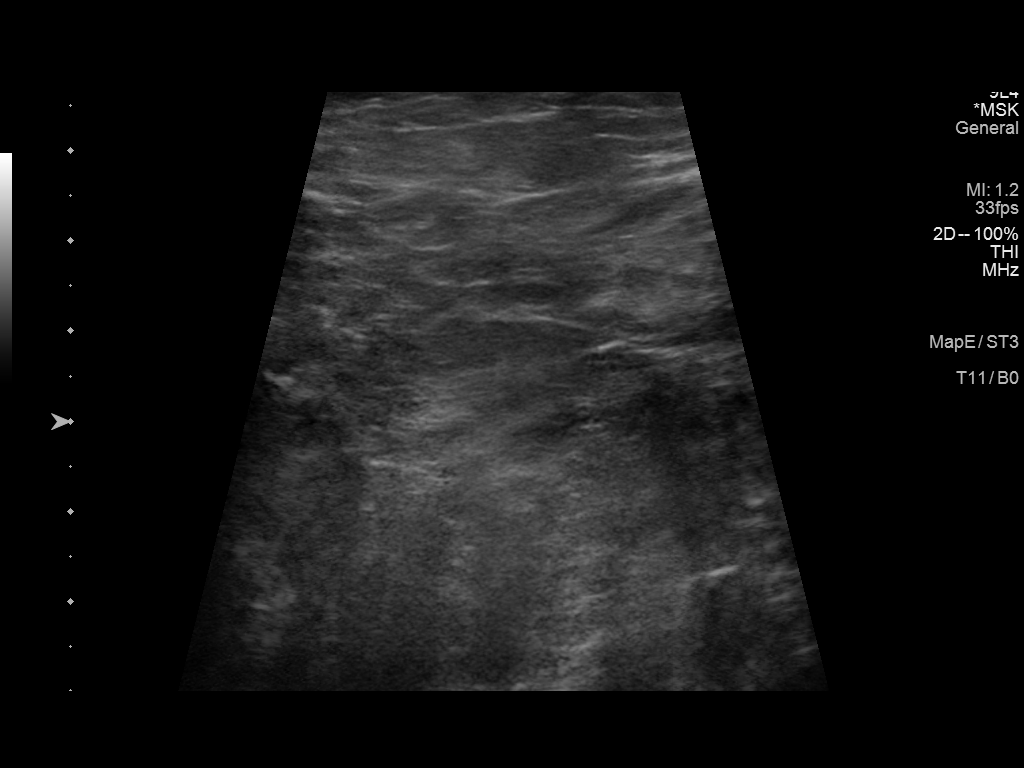
[im 5/5]
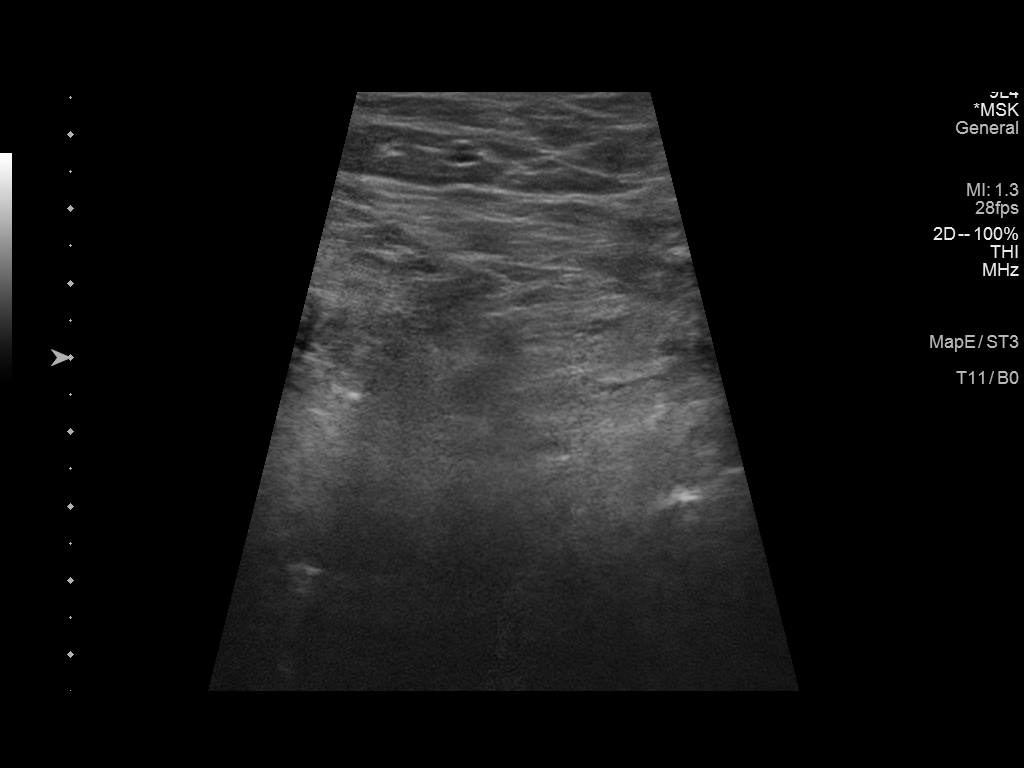

[5 of 5 positions shown; findings below may reference images not displayed]

FINDINGS: Interrogation of the right inguinal region in the area of symptoms
reveals no cystic or solid mass. No hernia sac or bowel is observed.
No abnormal vascularity is demonstrated.
IMPRESSION: No abnormality observed in the right inguinal region in the area of
clinical concern.

## 2021-07-08 LAB — PSA: PSA: 0.86

## 2022-01-13 LAB — LAB REPORT - SCANNED
A1c: 6
EGFR: 63

## 2022-04-30 ENCOUNTER — Ambulatory Visit (INDEPENDENT_AMBULATORY_CARE_PROVIDER_SITE_OTHER): Payer: Medicare Other

## 2022-04-30 ENCOUNTER — Ambulatory Visit
Admission: RE | Admit: 2022-04-30 | Discharge: 2022-04-30 | Disposition: A | Discharge status: Home / Self Care | Payer: Medicare Other | Source: Ambulatory Visit | Attending: Urgent Care | Admitting: Urgent Care

## 2022-04-30 VITALS — BP 148/71 | HR 82 | Temp 98.3°F | Resp 20

## 2022-04-30 DIAGNOSIS — M546 Pain in thoracic spine: Secondary | ICD-10-CM

## 2022-04-30 DIAGNOSIS — J189 Pneumonia, unspecified organism: Secondary | ICD-10-CM | POA: Diagnosis not present

## 2022-04-30 DIAGNOSIS — G8929 Other chronic pain: Secondary | ICD-10-CM

## 2022-04-30 DIAGNOSIS — I10 Essential (primary) hypertension: Secondary | ICD-10-CM

## 2022-04-30 HISTORY — DX: Pure hypercholesterolemia, unspecified: E78.00

## 2022-04-30 MED ORDER — AMOXICILLIN-POT CLAVULANATE 875-125 MG PO TABS: 1.0000 | ORAL_TABLET | Freq: Two times a day (BID) | ORAL | 0 refills | Status: DC

## 2022-04-30 MED ORDER — AZITHROMYCIN 250 MG PO TABS: ORAL_TABLET | ORAL | 0 refills | Status: DC

## 2022-04-30 NOTE — ED Triage Notes (Signed)
Pt c/o upper/mid back pain with movement-started in 02/2022-denies injury-NAD-steady gait

## 2022-04-30 NOTE — Discharge Instructions (Signed)
Start Augmentin and azithromycin to address left sided pneumonia as seen on the x-ray. Follow up with the cardiologist as needed.

## 2022-04-30 NOTE — ED Provider Notes (Signed)
[facility]  Note:  This document was prepared using Systems analyst and may include unintentional dictation errors.  MRN: [id number] DOB: 1945-03-14  Subjective:   Sean Cabrera is a 77 y.o. male presenting for significant concern about his heart.   Symptoms started out with a cough in November or December.  That resolved but persisted to have left-sided internal thoracic back pain.  Patient had somewhat concerned about his heart that he went [place] cardiology clinical [place] a [place].  Had an echocardiogram and general cardiac work up done 04/08/2021.Had EF 60%-65%.  Was generally reassuring EKG with mild aortic regurgitation, mild dilatation of ascending aorta (32m). EKG was negative for acute findings. Had lipid panel done that only showed 38 HDL, chol/HDL was 4. All other labs were normal. No smoking. Has a history of HTN.  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  This turned out to be benign.  Happened years ago.  No current facility-administered medications for this encounter.  Current Outpatient Medications:    allopurinol (ZYLOPRIM) 300 MG tablet, Take 300 mg by mouth daily., Disp: , Rfl: 2   aspirin EC 81 MG tablet, Take 81 mg by mouth daily., Disp: , Rfl:    enalapril (VASOTEC) 20 MG tablet, Take 20 mg by mouth 2 (two) times daily., Disp: , Rfl: 1   fenofibrate (TRICOR) 145 MG tablet, Take 145 mg by mouth every other day., Disp: , Rfl: 2   oxyCODONE-acetaminophen (ROXICET) 5-325 MG tablet, Take 1-2 tablets by mouth every 4 (four) hours as needed., Disp: 30 tablet, Rfl: 0   No Known Allergies  Past Medical History:  Diagnosis Date   Gout    High cholesterol    Hypertension    Sleep apnea    lost 30 lbs not used cpap since 2010     Past Surgical History:  Procedure Laterality Date   CYSTECTOMY     removed off left lung  benign   INGUINAL HERNIA REPAIR Right 07/08/2016    Procedure: LAPAROSCOPIC RIGHT INGUINAL HERNIA REPAIR WITH MESH;  Surgeon: ARalene Ok MD;  Location: [facility]  Service: General;  Laterality: Right;   INSERTION OF MESH Right 07/08/2016   Procedure: INSERTION OF MESH;  Surgeon: ARalene Ok MD;  Location: [facility]  Service: General;  Laterality: Right;    No family history on file.  Social History   Tobacco Use   Smoking status: Never   Smokeless tobacco: Never  Vaping Use   Vaping Use: Never used  Substance Use Topics   Alcohol use: No   Drug use: No    ROS   Objective:   Vitals: BP (!) 148/71 (BP Location: Right Arm)   Pulse 82   Temp 98.3 F (36.8 C) (Oral)   Resp 20   SpO2 97%   Physical Exam Constitutional:      General: He is not in acute distress.    Appearance: Normal appearance. He is well-developed. He is not ill-appearing, toxic-appearing or diaphoretic.  HENT:     Head: Normocephalic and atraumatic.     Right Ear: External ear normal.     Left Ear: External ear normal.     Nose: Nose normal.     Mouth/Throat:     Mouth: Mucous membranes are moist.  Eyes:     General: No scleral icterus.       Right eye: No discharge.  Left eye: No discharge.     Extraocular Movements: Extraocular movements intact.  Cardiovascular:     Rate and Rhythm: Normal rate and regular rhythm.     Heart sounds: Normal heart sounds. No murmur heard.    No friction rub. No gallop.  Pulmonary:     Effort: Pulmonary effort is normal. No respiratory distress.     Breath sounds: Normal breath sounds. No stridor. No wheezing, rhonchi or rales.  Musculoskeletal:     Thoracic back: Tenderness (describes internal left sided thoracic back pain) present. No swelling, edema, deformity, signs of trauma, lacerations, spasms or bony tenderness. Normal range of motion. No scoliosis.       Back:  Neurological:     Mental Status: He is alert and oriented to person, place, and time.  Psychiatric:        Mood and Affect: Mood  normal.        Behavior: Behavior normal.        Thought Content: Thought content normal.    DG Chest 2 View  Result Date: 04/30/2022 CLINICAL DATA:  Cough EXAM: CHEST - 2 VIEW COMPARISON:  None Available. FINDINGS: The heart size and mediastinal contours are within normal limits. Postsurgical changes in the left lung. Streaky left perihilar airspace opacity. No pleural effusion or pneumothorax. Degenerative changes of the thoracic spine. IMPRESSION: Streaky left perihilar airspace opacity, which may reflect atelectasis versus pneumonia. Electronically Signed   By: Davina Poke D.O.   On: 04/30/2022 10:32    Assessment and Plan :   PDMP not reviewed this encounter.  1. Pneumonia of left lower lobe due to infectious organism   2. Chronic left-sided thoracic back pain   3. Essential hypertension     Patient requested cardiology referral which I placed to the [facility].  Deferred EKG given lack of chest pain, cardiac symptoms.  Also reviewed his lab work extensively.  He requested repeat labs including cardiac enzymes.  I advised that this is inappropriate for the urgent care setting.  Recommended that he present to the emergency room if he has cardiac symptoms.  Otherwise wait to follow-up with the cardiologist.  For now, will manage for.  Acquired pneumonia of the left side given the streaky left perihilar airspace opacities.  Recommended supportive care.  Follow-up with PCP.  Counseled patient on potential for adverse effects with medications prescribed/recommended today, ER and return-to-clinic precautions discussed, patient verbalized understanding.    Jaynee Eagles, [place] 04/30/22 1114

## 2022-05-08 ENCOUNTER — Ambulatory Visit (INDEPENDENT_AMBULATORY_CARE_PROVIDER_SITE_OTHER): Payer: Medicare Other

## 2022-05-08 ENCOUNTER — Ambulatory Visit
Admission: EM | Admit: 2022-05-08 | Discharge: 2022-05-08 | Disposition: A | Discharge status: Home / Self Care | Payer: Medicare Other | Attending: Urgent Care | Admitting: Urgent Care

## 2022-05-08 DIAGNOSIS — M4810 Ankylosing hyperostosis [Forestier], site unspecified: Secondary | ICD-10-CM

## 2022-05-08 DIAGNOSIS — Z8781 Personal history of (healed) traumatic fracture: Secondary | ICD-10-CM | POA: Diagnosis not present

## 2022-05-08 DIAGNOSIS — M546 Pain in thoracic spine: Secondary | ICD-10-CM

## 2022-05-08 MED ORDER — METHOCARBAMOL 500 MG PO TABS: 500.0000 mg | ORAL_TABLET | Freq: Two times a day (BID) | ORAL | 0 refills | Status: DC

## 2022-05-08 MED ORDER — ACETAMINOPHEN 325 MG PO TABS: 650.0000 mg | ORAL_TABLET | Freq: Four times a day (QID) | ORAL | 0 refills | Status: DC | PRN

## 2022-05-08 NOTE — Discharge Instructions (Addendum)
Do not use any nonsteroidal anti-inflammatories (NSAIDs) like ibuprofen, Motrin, naproxen, Aleve, etc. which are all available over-the-counter.  Please just use Tylenol at a dose of '500mg'$ -'650mg'$  once every 6 hours as needed for your aches, pains, fevers. You can use Robaxin as needed for a muscle relaxant.

## 2022-05-08 NOTE — ED Provider Notes (Signed)
[facility]  Note:  This document was prepared using Systems analyst and may include unintentional dictation errors.  MRN: [id number] DOB: 12-Feb-1946  Subjective:   Sean Cabrera is a 77 y.o. male presenting for recheck on persistent left thoracic back pain.  At his last office visit, patient was found to have community-acquired pneumonia of the left side.  He is nearly complete with his treatment.  Reports that he is somewhat better but still has pain with movement.  No fall, trauma, rash.  Patient did set up a cardiology appointment in 3 weeks.  No overt chest pain, shortness of breath, heart racing, fever, coughing, hemoptysis, diaphoresis, nausea, vomiting, abdominal pain.  No smoking.  No asthma.  No current facility-administered medications for this encounter.  Current Outpatient Medications:    allopurinol (ZYLOPRIM) 300 MG tablet, Take 300 mg by mouth daily., Disp: , Rfl: 2   amoxicillin-clavulanate (AUGMENTIN) 875-125 MG tablet, Take 1 tablet by mouth 2 (two) times daily., Disp: 20 tablet, Rfl: 0   aspirin EC 81 MG tablet, Take 81 mg by mouth daily., Disp: , Rfl:    azithromycin (ZITHROMAX) 250 MG tablet, Day 1: take 2 tablets. Day 2-5: Take 1 tablet daily., Disp: 6 tablet, Rfl: 0   enalapril (VASOTEC) 20 MG tablet, Take 20 mg by mouth 2 (two) times daily., Disp: , Rfl: 1   fenofibrate (TRICOR) 145 MG tablet, Take 145 mg by mouth every other day., Disp: , Rfl: 2   oxyCODONE-acetaminophen (ROXICET) 5-325 MG tablet, Take 1-2 tablets by mouth every 4 (four) hours as needed., Disp: 30 tablet, Rfl: 0   No Known Allergies  Past Medical History:  Diagnosis Date   Gout    High cholesterol    Hypertension    Sleep apnea    lost 30 lbs not used cpap since 2010     Past Surgical History:  Procedure Laterality Date   CYSTECTOMY     removed off left lung  benign   INGUINAL HERNIA REPAIR Right 07/08/2016   Procedure: LAPAROSCOPIC RIGHT  INGUINAL HERNIA REPAIR WITH MESH;  Surgeon: Ralene Ok, MD;  Location: [facility];  Service: General;  Laterality: Right;   INSERTION OF MESH Right 07/08/2016   Procedure: INSERTION OF MESH;  Surgeon: Ralene Ok, MD;  Location: [facility];  Service: General;  Laterality: Right;    History reviewed. No pertinent family history.  Social History   Tobacco Use   Smoking status: Never   Smokeless tobacco: Never  Vaping Use   Vaping Use: Never used  Substance Use Topics   Alcohol use: No   Drug use: No    ROS   Objective:   Vitals: BP 135/74 (BP Location: Left Arm)   Pulse 78   Temp 98.3 F (36.8 C) (Oral)   Resp 18   SpO2 95%   Physical Exam Constitutional:      General: He is not in acute distress.    Appearance: Normal appearance. He is well-developed. He is not ill-appearing, toxic-appearing or diaphoretic.  HENT:     Head: Normocephalic and atraumatic.     Right Ear: External ear normal.     Left Ear: External ear normal.     Nose: Nose normal.     Mouth/Throat:     Mouth: Mucous membranes are moist.  Eyes:     General: No scleral icterus.       Right eye: No discharge.        Left  eye: No discharge.     Extraocular Movements: Extraocular movements intact.  Cardiovascular:     Rate and Rhythm: Normal rate and regular rhythm.     Heart sounds: Normal heart sounds. No murmur heard.    No friction rub. No gallop.  Pulmonary:     Effort: Pulmonary effort is normal. No respiratory distress.     Breath sounds: Normal breath sounds. No stridor. No wheezing, rhonchi or rales.  Musculoskeletal:     Thoracic back: Tenderness (over area outlined) present. No swelling, edema, deformity, signs of trauma, lacerations, spasms or bony tenderness. Normal range of motion. No scoliosis.       Back:  Skin:    General: Skin is warm and dry.  Neurological:     Mental Status: He is alert and oriented to person, place, and time.  Psychiatric:        Mood and Affect: Mood normal.         Behavior: Behavior normal.        Thought Content: Thought content normal.    DG Thoracic Spine 2 View  Result Date: 05/08/2022 CLINICAL DATA:  Left upper back pain. EXAM: THORACIC SPINE 2 VIEWS COMPARISON:  Chest x-ray dated April 30, 2022. FINDINGS: Twelve rib-bearing thoracic vertebral bodies. No acute fracture or subluxation. Vertebral body heights are preserved. Alignment is normal. Multilevel bridging endplate osteophytes with relatively preserved disc spaces. Postsurgical changes in the left lung. IMPRESSION: 1. No acute osseous abnormality. 2. Diffuse idiopathic skeletal hyperostosis. Electronically Signed   By: Titus Dubin M.D.   On: 05/08/2022 12:02   DG Chest 2 View  Result Date: 05/08/2022 CLINICAL DATA:  Left-sided thoracic back pain. EXAM: CHEST - 2 VIEW COMPARISON:  Chest x-ray dated April 30, 2022. FINDINGS: The heart size and mediastinal contours are within normal limits. Postsurgical changes in the left lung. Unchanged streaky left parahilar opacity, favor atelectasis or scarring. No focal consolidation, pleural effusion, or pneumothorax. No acute osseous abnormality. Deformity of the left posterior fourth rib is likely postsurgical. Old healed fracture of the left posterior fifth rib. IMPRESSION: No active cardiopulmonary disease. Electronically Signed   By: Titus Dubin M.D.   On: 05/08/2022 12:00    Assessment and Plan :   PDMP not reviewed this encounter.  1. Acute left-sided thoracic back pain   2. History of rib fracture   3. Diffuse idiopathic skeletal hyperostosis     Counseled against further antibiotic use.  Recommended managing for musculoskeletal pain.  Patient states that the pain is very mild and may not end up taking dictation.  Follow-up with an orthopedist as necessary.  Maintain appointment with the cardiologist. Counseled patient on potential for adverse effects with medications prescribed/recommended today, ER and return-to-clinic  precautions discussed, patient verbalized understanding.    Jaynee Eagles, [place] 05/08/22 [id number]

## 2022-05-08 NOTE — ED Triage Notes (Signed)
Pt continues with upper/mid back pain below shoulder blade that is worse with movement.

## 2022-05-25 NOTE — Progress Notes (Unsigned)
Cardiology Office Note:    Date:  05/27/2022   ID:  Sean Cabrera, DOB 1945-11-16, MRN [id number]  PCP:  Josem Kaufmann, MD   [facility] Providers Cardiologist:  None     Referring MD: Jaynee Eagles, PA-C   Chief Complaint  Patient presents with   Chest Pain    History of Present Illness:    Sean Cabrera is a 77 y.o. male is self referred for evaluation of chest pain. He has a history of HTN, HLD, and  history of OSA on CPAP. His ex wife Sean Cabrera works in our [employer]. He reports he was abroad in [place] and had a cold. He developed a pain below his left scapula. Denied any cough, fever or other chest pain. Was seen by a cardiologist. Ecg was normal. Echo done showed aortic dilation 4.2 cm and mild AI. LV function was OK. He states since then the thoracic pain has become less but is still there. He states it is worse when he is upright or with twisting. Was seen at Urgent care. CXR suggested possible PNA so he was treated with antibiotics. Now he states the pain is 1/10. He otherwise feels well. Notes BP has been consistently in the 0000000 systolic range at home. Reports he had a stress test 8 years ago that was OK.   Past Medical History:  Diagnosis Date   Gout    High cholesterol    Hypertension    Sleep apnea    lost 30 lbs not used cpap since 2010    Past Surgical History:  Procedure Laterality Date   CYSTECTOMY     removed off left lung  benign   INGUINAL HERNIA REPAIR Right 07/08/2016   Procedure: LAPAROSCOPIC RIGHT INGUINAL HERNIA REPAIR WITH MESH;  Surgeon: Ralene Ok, MD;  Location: [facility];  Service: General;  Laterality: Right;   INSERTION OF MESH Right 07/08/2016   Procedure: INSERTION OF MESH;  Surgeon: Ralene Ok, MD;  Location: [facility];  Service: General;  Laterality: Right;    Current Medications: Current Meds  Medication Sig   allopurinol (ZYLOPRIM) 300 MG tablet Take 300 mg by mouth daily.   fenofibrate (TRICOR) 145 MG tablet Take 145  mg by mouth every other day.   valsartan (DIOVAN) 160 MG tablet Take 1 tablet (160 mg total) by mouth daily.   [DISCONTINUED] enalapril (VASOTEC) 20 MG tablet Take 20 mg by mouth 2 (two) times daily.     Allergies:   Patient has no known allergies.   Social History   Socioeconomic History   Marital status: Divorced    Spouse name: Not on file   Number of children: 2   Years of education: Not on file   Highest education level: Not on file  Occupational History   Not on file  Tobacco Use   Smoking status: Never   Smokeless tobacco: Never  Vaping Use   Vaping Use: Never used  Substance and Sexual Activity   Alcohol use: No   Drug use: No   Sexual activity: Not on file  Other Topics Concern   Not on file  Social History Narrative   Not on file   Social Determinants of Health   Financial Resource Strain: Not on file  Food Insecurity: Not on file  Transportation Needs: Not on file  Physical Activity: Not on file  Stress: Not on file  Social Connections: Not on file     Family History: The patient's family history includes Diabetes  in his father; Hypertension in his mother.  ROS:   Please see the history of present illness.     All other systems reviewed and are negative.  EKGs/Labs/Other Studies Reviewed:    The following studies were reviewed today: Echo as noted.  EKG:  EKG is  ordered today.  The ekg ordered today demonstrates NSR rate 75. Normal. I have personally reviewed and interpreted this study.   Recent Labs: No results found for requested labs within last 365 days.  Recent Lipid Panel No results found for: "CHOL", "TRIG", "HDL", "CHOLHDL", "VLDL", "LDLCALC", "LDLDIRECT"   Risk Assessment/Calculations:                Physical Exam:    VS:  BP 118/60 (BP Location: Left Arm, Patient Position: Sitting, Cuff Size: Large)   Pulse 75   Ht 5\' 10"  (1.778 m)   Wt 265 lb 9.6 oz (120.5 kg)   SpO2 98%   BMI 38.11 kg/m     Wt Readings from Last 3  Encounters:  05/27/22 265 lb 9.6 oz (120.5 kg)  07/08/16 260 lb (117.9 kg)  07/06/16 260 lb 1.6 oz (118 kg)     GEN:  Well nourished, overweight in no acute distress HEENT: Normal NECK: No JVD; No carotid bruits LYMPHATICS: No lymphadenopathy CARDIAC: RRR, very soft flow murmur LSB,  rubs, gallops RESPIRATORY:  Clear to auscultation without rales, wheezing or rhonchi  ABDOMEN: Soft, non-tender, non-distended MUSCULOSKELETAL:  No edema; No deformity  SKIN: Warm and dry NEUROLOGIC:  Alert and oriented x 3 PSYCHIATRIC:  Normal affect   ASSESSMENT:    1. Musculoskeletal chest pain   2. Aneurysm of ascending aorta without rupture ([place])   3. Primary hypertension   4. Mixed hyperlipidemia    PLAN:    In order of problems listed above:  Atypical chest pain c/w musculoskeletal pain. Improving. Would consider NSAID but he states pain is not really bothersome now.  Thoracic aortic aneurysm based on Echo 4.2 cm. Recommend CT chest with contrast to further delineate. Optimize BP control HTN. Suboptimal control based on home readings. Will switch enalapril to valsartan 160 mg daily. Sodium restriction. Lifestyle modification with weight loss and regular aerobic activity. Goal BP < 130/80 HLD. On fenofibrate. Will look for coronary calcification on CT. If present would recommend adding statin therapy with goal LDL <70          Medication Adjustments/Labs and Tests Ordered: Current medicines are reviewed at length with the patient today.  Concerns regarding medicines are outlined above.  Orders Placed This Encounter  Procedures   CT ANGIO CHEST AORTA W/CM & OR WO/CM   EKG 12-Lead   Meds ordered this encounter  Medications   valsartan (DIOVAN) 160 MG tablet    Sig: Take 1 tablet (160 mg total) by mouth daily.    Dispense:  90 tablet    Refill:  3    There are no Patient Instructions on file for this visit.   Signed, Mansel Strother [place], MD  05/27/2022 1:29 PM    [place]

## 2022-05-27 ENCOUNTER — Ambulatory Visit (INDEPENDENT_AMBULATORY_CARE_PROVIDER_SITE_OTHER): Payer: Medicare Other | Admitting: Family Medicine

## 2022-05-27 ENCOUNTER — Ambulatory Visit: Payer: Medicare Other | Attending: Cardiology | Admitting: Cardiology

## 2022-05-27 ENCOUNTER — Encounter: Payer: Self-pay | Admitting: Family Medicine

## 2022-05-27 ENCOUNTER — Encounter: Payer: Self-pay | Admitting: Cardiology

## 2022-05-27 VITALS — BP 118/60 | HR 75 | Ht 70.0 in | Wt 265.6 lb

## 2022-05-27 VITALS — BP 132/60 | HR 85 | Temp 97.9°F | Ht 70.0 in | Wt 268.0 lb

## 2022-05-27 DIAGNOSIS — R0789 Other chest pain: Secondary | ICD-10-CM

## 2022-05-27 DIAGNOSIS — I10 Essential (primary) hypertension: Secondary | ICD-10-CM

## 2022-05-27 DIAGNOSIS — E785 Hyperlipidemia, unspecified: Secondary | ICD-10-CM | POA: Insufficient documentation

## 2022-05-27 DIAGNOSIS — M109 Gout, unspecified: Secondary | ICD-10-CM | POA: Insufficient documentation

## 2022-05-27 DIAGNOSIS — I7121 Aneurysm of the ascending aorta, without rupture: Secondary | ICD-10-CM | POA: Diagnosis present

## 2022-05-27 DIAGNOSIS — E782 Mixed hyperlipidemia: Secondary | ICD-10-CM

## 2022-05-27 DIAGNOSIS — D1430 Benign neoplasm of unspecified bronchus and lung: Secondary | ICD-10-CM | POA: Insufficient documentation

## 2022-05-27 DIAGNOSIS — R7303 Prediabetes: Secondary | ICD-10-CM | POA: Diagnosis not present

## 2022-05-27 DIAGNOSIS — M1A9XX0 Chronic gout, unspecified, without tophus (tophi): Secondary | ICD-10-CM

## 2022-05-27 DIAGNOSIS — Z902 Acquired absence of lung [part of]: Secondary | ICD-10-CM | POA: Insufficient documentation

## 2022-05-27 DIAGNOSIS — Z7689 Persons encountering health services in other specified circumstances: Secondary | ICD-10-CM

## 2022-05-27 MED ORDER — VALSARTAN 160 MG PO TABS: 160.0000 mg | ORAL_TABLET | Freq: Every day | ORAL | 3 refills | Status: DC

## 2022-05-27 NOTE — Patient Instructions (Signed)
Thank you for trusting [place] with your healthcare.   Please return for fasting labs.   We will be in touch with your results and recommendations.

## 2022-05-27 NOTE — Progress Notes (Signed)
New Patient Office Visit  Subjective    Patient ID: Sean Cabrera, male    DOB: [birth year]  Age: 77 y.o. MRN: [id number]  CC:  Chief Complaint  Patient presents with   Establish Care    No concerns, coming from [place] and having records sent over    HPI Sean Cabrera presents to establish care Previous PCP in [place].   He saw his cardiologist today.  Enalapril switched to valsartan 160 mg.   HLD- taking fenofibrate. Having CT per cardiologist and will consider statin   Incidental finding of thoracic aortic aneurysm per echo 4.2 cm. cardiologist ordering CT chest with contrast.   He has a history of HTN, HLD, and  history of OSA on CPAP.   Gout hx. Last flare 4-5 years ago. Taking allopurinol 300 mg  Watching diet.     Outpatient Encounter Medications as of 05/27/2022  Medication Sig   allopurinol (ZYLOPRIM) 300 MG tablet Take 300 mg by mouth daily.   fenofibrate (TRICOR) 145 MG tablet Take 145 mg by mouth every other day.   valsartan (DIOVAN) 160 MG tablet Take 1 tablet (160 mg total) by mouth daily.   [DISCONTINUED] acetaminophen (TYLENOL) 325 MG tablet Take 2 tablets (650 mg total) by mouth every 6 (six) hours as needed for moderate pain. (Patient not taking: Reported on 05/27/2022)   [DISCONTINUED] aspirin EC 81 MG tablet Take 81 mg by mouth daily. (Patient not taking: Reported on 05/27/2022)   No facility-administered encounter medications on file as of 05/27/2022.    Past Medical History:  Diagnosis Date   Gout    High cholesterol    Hypertension    Sleep apnea    lost 30 lbs not used cpap since 2010    Past Surgical History:  Procedure Laterality Date   CYSTECTOMY     removed off left lung  benign   HERNIA REPAIR     INGUINAL HERNIA REPAIR Right 07/08/2016   Procedure: LAPAROSCOPIC RIGHT INGUINAL HERNIA REPAIR WITH MESH;  Surgeon: Ralene Ok, MD;  Location: [facility];  Service: General;  Laterality: Right;   INSERTION OF MESH Right 07/08/2016    Procedure: INSERTION OF MESH;  Surgeon: Ralene Ok, MD;  Location: [facility];  Service: General;  Laterality: Right;    Family History  Problem Relation Age of Onset   Hypertension Mother    Diabetes Father     Social History   Socioeconomic History   Marital status: Divorced    Spouse name: Not on file   Number of children: 2   Years of education: Not on file   Highest education level: Not on file  Occupational History   Not on file  Tobacco Use   Smoking status: Never   Smokeless tobacco: Never  Vaping Use   Vaping Use: Never used  Substance and Sexual Activity   Alcohol use: No   Drug use: No   Sexual activity: Not Currently  Other Topics Concern   Not on file  Social History Narrative   Not on file   Social Determinants of Health   Financial Resource Strain: Not on file  Food Insecurity: Not on file  Transportation Needs: Not on file  Physical Activity: Not on file  Stress: Not on file  Social Connections: Not on file  Intimate Partner Violence: Not on file    Review of Systems  Constitutional:  Negative for chills and fever.  Respiratory:  Negative for shortness of breath.   Cardiovascular:  Negative  for chest pain, palpitations and leg swelling.  Gastrointestinal:  Negative for abdominal pain, constipation, diarrhea, nausea and vomiting.  Genitourinary:  Negative for dysuria, frequency and urgency.  Neurological:  Negative for dizziness.        Objective    BP 132/60 (BP Location: Left Arm, Patient Position: Sitting, Cuff Size: Large)   Pulse 85   Temp 97.9 F (36.6 C) (Temporal)   Ht 5\' 10"  (1.778 m)   Wt 268 lb (121.6 kg)   SpO2 98%   BMI 38.45 kg/m   Physical Exam Constitutional:      General: He is not in acute distress.    Appearance: He is not ill-appearing.  Eyes:     Extraocular Movements: Extraocular movements intact.     Conjunctiva/sclera: Conjunctivae normal.  Cardiovascular:     Rate and Rhythm: Normal rate and regular  rhythm.  Pulmonary:     Effort: Pulmonary effort is normal.     Breath sounds: Normal breath sounds.  Musculoskeletal:     Cervical back: Normal range of motion and neck supple.  Skin:    General: Skin is warm and dry.  Neurological:     General: No focal deficit present.     Mental Status: He is alert and oriented to person, place, and time.  Psychiatric:        Mood and Affect: Mood normal.        Behavior: Behavior normal.        Thought Content: Thought content normal.         Assessment & Plan:   Problem List Items Addressed This Visit       Cardiovascular and Mediastinum   Aneurysm of ascending aorta without rupture ([place])   Hypertension - Primary   Relevant Orders   Basic metabolic panel   CBC with Differential/Platelet     Other   Gout   Relevant Orders   Uric acid   Hyperlipemia   Relevant Orders   Lipid panel   Prediabetes   Relevant Orders   Hemoglobin A1c   Other Visit Diagnoses     Encounter to establish care          Reviewed notes from cardiologist and labs brought in today from fall 2023  Prediabetes with A1c 6.0% HL- he may need statin therapy per cardiologist pending CT coronary score  Gout- no attacks in 4-5 years. Continue allopurinol and check uric acid level.   Return for pending labs.   Harland Dingwall, NP-C

## 2022-05-27 NOTE — Patient Instructions (Signed)
Medication Instructions:  Stop Enalapril  Start Valsartan 160 mg daily  Continue all other medications   Lab Work: To be done with PCP   Testing/Procedures: Schedule Chest CT Angio   Follow-Up: At [facility], you and your health needs are our priority.  As part of our continuing mission to provide you with exceptional heart care, we have created designated Provider Care Teams.  These Care Teams include your primary Cardiologist (physician) and Advanced Practice Providers (APPs -  Physician Assistants and Nurse Practitioners) who all work together to provide you with the care you need, when you need it.  We recommend signing up for the patient portal called "MyChart".  Sign up information is provided on this After Visit Summary.  MyChart is used to connect with patients for Virtual Visits (Telemedicine).  Patients are able to view lab/test results, encounter notes, upcoming appointments, etc.  Non-urgent messages can be sent to your provider as well.   To learn more about what you can do with MyChart, go to [email or web address].    Your next appointment:  To Be Determined after test    Provider:  Dr.Jordan

## 2022-05-28 ENCOUNTER — Encounter: Payer: Self-pay | Admitting: Family Medicine

## 2022-05-28 LAB — CBC WITH DIFFERENTIAL/PLATELET
Basophils Absolute: 0 10*3/uL (ref 0.0–0.1)
Basophils Relative: 0.4 % (ref 0.0–3.0)
Eosinophils Absolute: 0.1 10*3/uL (ref 0.0–0.7)
Eosinophils Relative: 1.5 % (ref 0.0–5.0)
HCT: 42.1 % (ref 39.0–52.0)
Hemoglobin: 14.1 g/dL (ref 13.0–17.0)
Lymphocytes Relative: 56.4 % — ABNORMAL HIGH (ref 12.0–46.0)
Lymphs Abs: 2.8 10*3/uL (ref 0.7–4.0)
MCHC: 33.6 g/dL (ref 30.0–36.0)
MCV: 93.7 fl (ref 78.0–100.0)
Monocytes Absolute: 0.4 10*3/uL (ref 0.1–1.0)
Monocytes Relative: 7.2 % (ref 3.0–12.0)
Neutro Abs: 1.7 10*3/uL (ref 1.4–7.7)
Neutrophils Relative %: 34.2 % — ABNORMAL LOW (ref 43.0–77.0)
Platelets: 229 10*3/uL (ref 150.0–400.0)
RBC: 4.49 Mil/uL (ref 4.22–5.81)
RDW: 13.4 % (ref 11.5–15.5)
WBC: 4.9 10*3/uL (ref 4.0–10.5)

## 2022-05-28 LAB — BASIC METABOLIC PANEL
BUN: 21 mg/dL (ref 6–23)
CO2: 28 mEq/L (ref 19–32)
Calcium: 9.8 mg/dL (ref 8.4–10.5)
Chloride: 102 mEq/L (ref 96–112)
Creatinine, Ser: 1.27 mg/dL (ref 0.40–1.50)
GFR: 55.47 mL/min — ABNORMAL LOW (ref >60.00)
Glucose, Bld: 99 mg/dL (ref 70–99)
Potassium: 4.8 mEq/L (ref 3.5–5.1)
Sodium: 136 mEq/L (ref 135–145)

## 2022-05-28 LAB — LIPID PANEL
Cholesterol: 152 mg/dL (ref 0–200)
HDL: 39.7 mg/dL (ref >39.00)
LDL Cholesterol: 89 mg/dL (ref 0–99)
NonHDL: 112.77
Total CHOL/HDL Ratio: 4
Triglycerides: 118 mg/dL (ref 0.0–149.0)
VLDL: 23.6 mg/dL (ref 0.0–40.0)

## 2022-05-28 LAB — URIC ACID: Uric Acid, Serum: 5.9 mg/dL (ref 4.0–7.8)

## 2022-05-28 LAB — HEMOGLOBIN A1C: Hgb A1c MFr Bld: 6.5 % (ref 4.6–6.5)

## 2022-05-31 NOTE — Progress Notes (Signed)
Please let him know that his diabetes test shows that he actually has diabetes with a Hgb A1c of 6.5%. This is the beginning of diabetes. We can have him return to discuss starting medication or he can watch sugar and carbohydrates (potatoes, bread, pasta, rice) and follow up in 3-4 months. Uric acid level is good. Ok to refill allopurinol if needed.

## 2022-06-08 ENCOUNTER — Other Ambulatory Visit: Payer: Self-pay | Admitting: Family Medicine

## 2022-06-08 DIAGNOSIS — E782 Mixed hyperlipidemia: Secondary | ICD-10-CM

## 2022-06-08 DIAGNOSIS — E119 Type 2 diabetes mellitus without complications: Secondary | ICD-10-CM

## 2022-06-08 DIAGNOSIS — I10 Essential (primary) hypertension: Secondary | ICD-10-CM

## 2022-06-08 DIAGNOSIS — M1A9XX0 Chronic gout, unspecified, without tophus (tophi): Secondary | ICD-10-CM

## 2022-06-11 ENCOUNTER — Ambulatory Visit (HOSPITAL_COMMUNITY)
Admission: RE | Admit: 2022-06-11 | Discharge: 2022-06-11 | Disposition: A | Discharge status: Home / Self Care | Payer: Medicare Other | Source: Ambulatory Visit | Attending: Cardiology | Admitting: Cardiology

## 2022-06-11 DIAGNOSIS — I7121 Aneurysm of the ascending aorta, without rupture: Secondary | ICD-10-CM | POA: Insufficient documentation

## 2022-06-11 DIAGNOSIS — R0789 Other chest pain: Secondary | ICD-10-CM | POA: Insufficient documentation

## 2022-06-11 DIAGNOSIS — I10 Essential (primary) hypertension: Secondary | ICD-10-CM | POA: Diagnosis present

## 2022-06-11 DIAGNOSIS — E782 Mixed hyperlipidemia: Secondary | ICD-10-CM | POA: Diagnosis present

## 2022-06-11 MED ORDER — IOHEXOL 350 MG/ML SOLN
75.0000 mL | Freq: Once | INTRAVENOUS | Status: AC | PRN
  Administered 2022-06-11: 75 mL via INTRAVENOUS

## 2022-06-12 ENCOUNTER — Other Ambulatory Visit: Payer: Self-pay

## 2022-06-12 ENCOUNTER — Encounter: Payer: Self-pay | Admitting: Cardiology

## 2022-06-12 ENCOUNTER — Other Ambulatory Visit: Payer: Self-pay | Admitting: Cardiology

## 2022-06-12 DIAGNOSIS — R079 Chest pain, unspecified: Secondary | ICD-10-CM

## 2022-06-12 DIAGNOSIS — I10 Essential (primary) hypertension: Secondary | ICD-10-CM

## 2022-06-12 DIAGNOSIS — E782 Mixed hyperlipidemia: Secondary | ICD-10-CM

## 2022-06-12 DIAGNOSIS — R7303 Prediabetes: Secondary | ICD-10-CM

## 2022-06-12 MED ORDER — ROSUVASTATIN CALCIUM 20 MG PO TABS: 20.0000 mg | ORAL_TABLET | Freq: Every day | ORAL | 3 refills | Status: DC

## 2022-06-12 MED ORDER — ASPIRIN 81 MG PO TBEC: 81.0000 mg | DELAYED_RELEASE_TABLET | Freq: Every day | ORAL | 12 refills | Status: AC

## 2022-06-12 NOTE — Telephone Encounter (Signed)
Spoke to patient chest ct results and recommendations given.Stress myoview scheduled 4/16 at 10:45 am.Advised to start Aspirin 81 mg daily.Start Crestor 20 mg daily.Repeat fasting lipid and hepatic panels in 3 moths.Lab orders mailed.Follow up appointment scheduled with Dr.Jordan 5/3 at 8:40 am.

## 2022-06-16 ENCOUNTER — Encounter (HOSPITAL_COMMUNITY): Payer: Self-pay | Admitting: *Deleted

## 2022-06-16 ENCOUNTER — Telehealth (HOSPITAL_COMMUNITY): Payer: Self-pay | Admitting: *Deleted

## 2022-06-16 NOTE — Telephone Encounter (Signed)
Letter sent to my chart with instructions for upcoming stress test.  Sean Cabrera [name]  

## 2022-06-17 ENCOUNTER — Encounter: Payer: Self-pay | Admitting: Family Medicine

## 2022-06-23 ENCOUNTER — Ambulatory Visit (HOSPITAL_COMMUNITY): Payer: Medicare Other | Attending: Cardiology

## 2022-06-23 DIAGNOSIS — R7303 Prediabetes: Secondary | ICD-10-CM

## 2022-06-23 DIAGNOSIS — I10 Essential (primary) hypertension: Secondary | ICD-10-CM | POA: Diagnosis present

## 2022-06-23 DIAGNOSIS — R079 Chest pain, unspecified: Secondary | ICD-10-CM | POA: Diagnosis present

## 2022-06-23 LAB — MYOCARDIAL PERFUSION IMAGING
Angina Index: 0
Duke Treadmill Score: 5
Estimated workload: 6.8
Exercise duration (min): 4 min
Exercise duration (sec): 53 s
LV dias vol: 84 mL (ref 62–150)
LV sys vol: 31 mL
MPHR: 145 {beats}/min
Nuc Stress EF: 63 %
Peak HR: 142 {beats}/min
Percent HR: 97 %
Rest HR: 76 {beats}/min
Rest Nuclear Isotope Dose: 10.9 mCi
SDS: 2
SRS: 0
SSS: 2
ST Depression (mm): 0 mm
Stress Nuclear Isotope Dose: 31 mCi
TID: 0.81

## 2022-06-23 MED ORDER — TECHNETIUM TC 99M TETROFOSMIN IV KIT
10.9000 | PACK | Freq: Once | INTRAVENOUS | Status: AC | PRN
  Administered 2022-06-23: 10.9 via INTRAVENOUS

## 2022-06-23 MED ORDER — TECHNETIUM TC 99M TETROFOSMIN IV KIT
31.0000 | PACK | Freq: Once | INTRAVENOUS | Status: AC | PRN
  Administered 2022-06-23: 31 via INTRAVENOUS

## 2022-06-25 ENCOUNTER — Encounter: Payer: Self-pay | Admitting: Family Medicine

## 2022-07-07 NOTE — Progress Notes (Signed)
Cardiology Office Note:    Date:  07/10/2022   ID:  Sean Cabrera, DOB Apr 22, 1945, MRN [id number]  PCP:  Avanell Shackleton, NP-C   [facility] Providers Cardiologist:  None     Referring MD: Avanell Shackleton, NP-C   Chief Complaint  Patient presents with   Follow-up    History of Present Illness:    Sean Cabrera is a 77 y.o. male is seen for follow up. He has a history of HTN, HLD, and  history of OSA on CPAP. His ex wife [name] works in our [employer]. He reports he was abroad in [place] and had a cold. He developed a pain below his left scapula. Denied any cough, fever or other chest pain. Was seen by a cardiologist. Ecg was normal. Echo done showed aortic dilation 4.2 cm and mild AI. LV function was OK. He states since then the thoracic pain has become less but is still there. He states it is worse when he is upright or with twisting. Was seen at Urgent care. CXR suggested possible PNA so he was treated with antibiotics. Now he states the pain is 1/10. He otherwise feels well. Notes BP has been consistently in the 135-140 systolic range at home. Reports he had a stress test 8 years ago that was OK. We performed CT chest which showed aortic dimension of 4 cm. No acute abnormality. Coronary calcification. Myoview study was normal.  On follow up today he is doing very well. Has really changed his diet with intermittent fasting and has lost 16 lbs. Goal is to get down to 200 lbs. He is getting ready to go to [place] this month for 4 months.    Past Medical History:  Diagnosis Date   Gout    High cholesterol    Hypertension    Sleep apnea    lost 30 lbs not used cpap since 2010    Past Surgical History:  Procedure Laterality Date   CYSTECTOMY     removed off left lung  benign   HERNIA REPAIR     INGUINAL HERNIA REPAIR Right 07/08/2016   Procedure: LAPAROSCOPIC RIGHT INGUINAL HERNIA REPAIR WITH MESH;  Surgeon: Axel Filler, MD;  Location: [facility];  Service: General;   Laterality: Right;   INSERTION OF MESH Right 07/08/2016   Procedure: INSERTION OF MESH;  Surgeon: Axel Filler, MD;  Location: [facility];  Service: General;  Laterality: Right;    Current Medications: Current Meds  Medication Sig   allopurinol (ZYLOPRIM) 300 MG tablet Take 300 mg by mouth daily.   aspirin EC 81 MG tablet Take 1 tablet (81 mg total) by mouth daily. Swallow whole.   fenofibrate (TRICOR) 145 MG tablet Take 145 mg by mouth every other day.   rosuvastatin (CRESTOR) 20 MG tablet Take 1 tablet (20 mg total) by mouth daily.   valsartan (DIOVAN) 160 MG tablet Take 1 tablet (160 mg total) by mouth daily.     Allergies:   Patient has no known allergies.   Social History   Socioeconomic History   Marital status: Divorced    Spouse name: Not on file   Number of children: 2   Years of education: Not on file   Highest education level: Not on file  Occupational History   Not on file  Tobacco Use   Smoking status: Never   Smokeless tobacco: Never  Vaping Use   Vaping Use: Never used  Substance and Sexual Activity   Alcohol use: No  Drug use: No   Sexual activity: Not Currently  Other Topics Concern   Not on file  Social History Narrative   Not on file   Social Determinants of Health   Financial Resource Strain: Not on file  Food Insecurity: Not on file  Transportation Needs: Not on file  Physical Activity: Not on file  Stress: Not on file  Social Connections: Not on file     Family History: The patient's family history includes Diabetes in his father; Hypertension in his mother.  ROS:   Please see the history of present illness.     All other systems reviewed and are negative.  EKGs/Labs/Other Studies Reviewed:    The following studies were reviewed today: CT ANGIOGRAPHY CHEST WITH CONTRAST   TECHNIQUE: Multidetector CT imaging of the chest was performed using the standard protocol during bolus administration of intravenous contrast. Multiplanar CT  image reconstructions and MIPs were obtained to evaluate the vascular anatomy.   RADIATION DOSE REDUCTION: This exam was performed according to the departmental dose-optimization program which includes automated exposure control, adjustment of the mA and/or kV according to patient size and/or use of iterative reconstruction technique.   CONTRAST:  75mL OMNIPAQUE IOHEXOL 350 MG/ML SOLN   COMPARISON:  None Available.   FINDINGS: Cardiovascular: No cardiomegaly or pericardial effusion. Atheromatous changes coronary arteries. Ascending thoracic aorta maximum diameter 4 cm. No dissection. No filling defects in pulmonary arteries to indicate PE.   Mediastinum/Nodes: No enlarged mediastinal, hilar, or axillary lymph nodes. Thyroid gland, trachea, and esophagus demonstrate no significant findings.   Lungs/Pleura: Postop changes left upper hemithorax with parenchymal suture. Lungs are clear. No pneumothorax or pleural effusion.   Upper Abdomen: Left kidney 3 cm cyst which does not need to be followed up. Otherwise unremarkable.   Musculoskeletal: Thoracic degenerative changes. Minimal bilateral gynecomastia.   Review of the MIP images confirms the above findings.   IMPRESSION: 1. Ectatic ascending thoracic aorta, 4 cm maximum diameter. 2. No dissection or PE. 3. Left kidney cyst. 4. Minimal bilateral gynecomastia.     Electronically Signed   By: Layla Maw M.D.   On: 06/11/2022 16:42     Myoview 06/23/22:  Study Highlights      The study is normal. The study is low risk.   No ST deviation was noted.   LV perfusion is normal. There is no evidence of ischemia. There is no evidence of infarction.   Left ventricular function is normal. Nuclear stress EF: 63 %. The left ventricular ejection fraction is normal (55-65%). End diastolic cavity size is normal. End systolic cavity size is normal.   Prior study not available for comparison.   Normal stress nuclear study with no  ischemia or infarction.  Gated ejection fraction 63% with normal wall motion.  EKG:  EKG is not  ordered today.   Recent Labs: 05/28/2022: BUN 21; Creatinine, Ser 1.27; Hemoglobin 14.1; Platelets 229.0; Potassium 4.8; Sodium 136  Recent Lipid Panel    Component Value Date/Time   CHOL 152 05/28/2022 0954   TRIG 118.0 05/28/2022 0954   HDL 39.70 05/28/2022 0954   CHOLHDL 4 05/28/2022 0954   VLDL 23.6 05/28/2022 0954   LDLCALC 89 05/28/2022 0954     Risk Assessment/Calculations:                Physical Exam:    VS:  BP 122/70   Pulse (!) 55   Ht 5\' 11"  (1.803 m)   Wt 252 lb (114.3 kg)  SpO2 100%   BMI 35.15 kg/m     Wt Readings from Last 3 Encounters:  07/10/22 252 lb (114.3 kg)  06/23/22 268 lb (121.6 kg)  05/27/22 268 lb (121.6 kg)     GEN:  Well nourished, overweight in no acute distress HEENT: Normal NECK: No JVD; No carotid bruits LYMPHATICS: No lymphadenopathy CARDIAC: RRR, very soft flow murmur LSB,  rubs, gallops RESPIRATORY:  Clear to auscultation without rales, wheezing or rhonchi  ABDOMEN: Soft, non-tender, non-distended MUSCULOSKELETAL:  No edema; No deformity  SKIN: Warm and dry NEUROLOGIC:  Alert and oriented x 3 PSYCHIATRIC:  Normal affect   ASSESSMENT:    1. Mixed hyperlipidemia   2. Primary hypertension   3. Aneurysm of ascending aorta without rupture ([facility])   4. Coronary artery calcification     PLAN:    In order of problems listed above:  Atypical chest pain c/w musculoskeletal pain. Improved. Thoracic aortic aneurysm based on Echo 4.2 cm.repeat CT showed 4 cm aorta. Myoview study was normal.  Optimize BP control HTN. Excellent control on valsartan. Continue lifestyle modification.  HLD. Mixed On fenofibrate. He does have significant coronary calcification. Started on statin. Goal LDL < 70. Will repeat labs. If triglycerides are good may consider stopping fenofibrate especially since he has done so well with weight loss. Coronary  calcification. Normal Myoview. Focus on risk factor modification.       Medication Adjustments/Labs and Tests Ordered: Current medicines are reviewed at length with the patient today.  Concerns regarding medicines are outlined above.  Orders Placed This Encounter  Procedures   Comprehensive Metabolic Panel (CMET)   Lipid panel   No orders of the defined types were placed in this encounter.   There are no Patient Instructions on file for this visit.   Signed, Iliani Vejar [place], MD  07/10/2022 9:00 AM    [facility]

## 2022-07-09 ENCOUNTER — Ambulatory Visit: Payer: Medicare Other | Admitting: Family Medicine

## 2022-07-10 ENCOUNTER — Ambulatory Visit: Payer: Medicare Other | Attending: Cardiology | Admitting: Cardiology

## 2022-07-10 ENCOUNTER — Encounter: Payer: Self-pay | Admitting: Cardiology

## 2022-07-10 VITALS — BP 122/70 | HR 55 | Ht 71.0 in | Wt 252.0 lb

## 2022-07-10 DIAGNOSIS — I2584 Coronary atherosclerosis due to calcified coronary lesion: Secondary | ICD-10-CM | POA: Diagnosis present

## 2022-07-10 DIAGNOSIS — I7121 Aneurysm of the ascending aorta, without rupture: Secondary | ICD-10-CM | POA: Diagnosis present

## 2022-07-10 DIAGNOSIS — I2510 Atherosclerotic heart disease of native coronary artery without angina pectoris: Secondary | ICD-10-CM | POA: Diagnosis present

## 2022-07-10 DIAGNOSIS — I10 Essential (primary) hypertension: Secondary | ICD-10-CM

## 2022-07-10 DIAGNOSIS — E782 Mixed hyperlipidemia: Secondary | ICD-10-CM | POA: Diagnosis present

## 2022-07-10 LAB — COMPREHENSIVE METABOLIC PANEL
ALT: 20 IU/L (ref 0–44)
AST: 31 IU/L (ref 0–40)
Albumin/Globulin Ratio: 2 (ref 1.2–2.2)
Albumin: 4.8 g/dL (ref 3.8–4.8)
Alkaline Phosphatase: 50 IU/L (ref 44–121)
BUN/Creatinine Ratio: 18 (ref 10–24)
BUN: 24 mg/dL (ref 8–27)
Bilirubin Total: 0.5 mg/dL (ref 0.0–1.2)
CO2: 24 mmol/L (ref 20–29)
Calcium: 9.6 mg/dL (ref 8.6–10.2)
Chloride: 100 mmol/L (ref 96–106)
Creatinine, Ser: 1.34 mg/dL — ABNORMAL HIGH (ref 0.76–1.27)
Globulin, Total: 2.4 g/dL (ref 1.5–4.5)
Glucose: 98 mg/dL (ref 70–99)
Potassium: 5.1 mmol/L (ref 3.5–5.2)
Sodium: 139 mmol/L (ref 134–144)
Total Protein: 7.2 g/dL (ref 6.0–8.5)
eGFR: 55 mL/min/{1.73_m2} — ABNORMAL LOW (ref >59)

## 2022-07-10 LAB — LIPID PANEL
Chol/HDL Ratio: 2.2 ratio (ref 0.0–5.0)
Cholesterol, Total: 95 mg/dL — ABNORMAL LOW (ref 100–199)
HDL: 44 mg/dL (ref >39)
LDL Chol Calc (NIH): 36 mg/dL (ref 0–99)
Triglycerides: 70 mg/dL (ref 0–149)
VLDL Cholesterol Cal: 15 mg/dL (ref 5–40)

## 2022-07-10 NOTE — Patient Instructions (Signed)
Medication Instructions:  Continue same medications *If you need a refill on your cardiac medications before your next appointment, please call your pharmacy*   Lab Work: Cmet,lipid panel today   Testing/Procedures: None ordered   Follow-Up: At [facility], you and your health needs are our priority.  As part of our continuing mission to provide you with exceptional heart care, we have created designated Provider Care Teams.  These Care Teams include your primary Cardiologist (physician) and Advanced Practice Providers (APPs -  Physician Assistants and Nurse Practitioners) who all work together to provide you with the care you need, when you need it.  We recommend signing up for the patient portal called "MyChart".  Sign up information is provided on this After Visit Summary.  MyChart is used to connect with patients for Virtual Visits (Telemedicine).  Patients are able to view lab/test results, encounter notes, upcoming appointments, etc.  Non-urgent messages can be sent to your provider as well.   To learn more about what you can do with MyChart, go to [email or web address].    Your next appointment:  03/2023    Call in Oct to schedule Jan appointment     Provider:  Dr.Jordan

## 2022-07-13 ENCOUNTER — Other Ambulatory Visit: Payer: Self-pay

## 2022-07-13 MED ORDER — ROSUVASTATIN CALCIUM 10 MG PO TABS: 10.0000 mg | ORAL_TABLET | Freq: Every day | ORAL | 3 refills | Status: DC

## 2022-07-14 ENCOUNTER — Encounter: Payer: Self-pay | Admitting: Cardiology

## 2022-07-14 NOTE — Telephone Encounter (Signed)
Pt requesting refill of allopurinol, this will be his 1st fill with you.. ok to refill?   Uric acid 5.9 (1 month ago)

## 2022-07-15 MED ORDER — ALLOPURINOL 300 MG PO TABS: 300.0000 mg | ORAL_TABLET | Freq: Every day | ORAL | 1 refills | Status: DC

## 2022-12-08 ENCOUNTER — Other Ambulatory Visit: Payer: Self-pay | Admitting: Family Medicine

## 2022-12-09 ENCOUNTER — Ambulatory Visit: Payer: Medicare Other | Admitting: Family Medicine

## 2022-12-09 ENCOUNTER — Other Ambulatory Visit: Payer: Medicare Other

## 2022-12-15 ENCOUNTER — Ambulatory Visit (INDEPENDENT_AMBULATORY_CARE_PROVIDER_SITE_OTHER): Payer: Medicare Other

## 2022-12-15 VITALS — Ht 71.0 in | Wt 252.0 lb

## 2022-12-15 DIAGNOSIS — Z0000 Encounter for general adult medical examination without abnormal findings: Secondary | ICD-10-CM

## 2022-12-15 NOTE — Progress Notes (Signed)
Subjective:   Sean Cabrera is a 77 y.o. male who presents for Medicare Annual/Subsequent preventive examination.  Visit Complete: Virtual I connected with  Sean Cabrera on 12/15/22 by a audio enabled telemedicine application and verified that I am speaking with the correct person using two identifiers.  Patient Location: Home  Provider Location: Home Office  I discussed the limitations of evaluation and management by telemedicine. The patient expressed understanding and agreed to proceed.  Vital Signs: Because this visit was a virtual/telehealth visit, some criteria may be missing or patient reported. Any vitals not documented were not able to be obtained and vitals that have been documented are patient reported.  Patient Medicare AWV questionnaire was completed by the patient on 12/14/22; I have confirmed that all information answered by patient is correct and no changes since this date.  Cardiac Risk Factors include: advanced age (>110men, >74 women);male gender;hypertensionBecause this visit was a virtual/telehealth visit, some criteria may be missing or patient reported. Any vitals not documented were not able to be obtained and vitals that have been documented are patient reported.       Objective:    Today's Vitals   12/15/22 1107  Weight: 252 lb (114.3 kg)  Height: 5\' 11"  (1.803 m)   Body mass index is 35.15 kg/m.     12/15/2022   11:15 AM 07/06/2016    8:33 AM  Advanced Directives  Does Patient Have a Medical Advance Directive? Yes No  Type of Estate agent of [place];Living will   Copy of Healthcare Power of Attorney in Chart? No - copy requested   Would patient like information on creating a medical advance directive?  Yes (MAU/Ambulatory/Procedural Areas - Information given)    Current Medications (verified) Outpatient Encounter Medications as of 12/15/2022  Medication Sig   rosuvastatin (CRESTOR) 10 MG tablet Take 1 tablet (10 mg  total) by mouth daily.   allopurinol (ZYLOPRIM) 300 MG tablet TAKE 1 TABLET BY MOUTH EVERY DAY   aspirin EC 81 MG tablet Take 1 tablet (81 mg total) by mouth daily. Swallow whole.   valsartan (DIOVAN) 160 MG tablet Take 1 tablet (160 mg total) by mouth daily.   No facility-administered encounter medications on file as of 12/15/2022.    Allergies (verified) Patient has no known allergies.   History: Past Medical History:  Diagnosis Date   Gout    High cholesterol    Hypertension    Sleep apnea    lost 30 lbs not used cpap since 2010   Past Surgical History:  Procedure Laterality Date   CYSTECTOMY     removed off left lung  benign   HERNIA REPAIR     INGUINAL HERNIA REPAIR Right 07/08/2016   Procedure: LAPAROSCOPIC RIGHT INGUINAL HERNIA REPAIR WITH MESH;  Surgeon: Axel Filler, MD;  Location: [facility] OR;  Service: General;  Laterality: Right;   INSERTION OF MESH Right 07/08/2016   Procedure: INSERTION OF MESH;  Surgeon: Axel Filler, MD;  Location: [facility] OR;  Service: General;  Laterality: Right;   Family History  Problem Relation Age of Onset   Hypertension Mother    Diabetes Father    Social History   Socioeconomic History   Marital status: Divorced    Spouse name: Not on file   Number of children: 2   Years of education: Not on file   Highest education level: Not on file  Occupational History   Not on file  Tobacco Use   Smoking status: Never  Smokeless tobacco: Never  Vaping Use   Vaping status: Never Used  Substance and Sexual Activity   Alcohol use: No   Drug use: No   Sexual activity: Not Currently  Other Topics Concern   Not on file  Social History Narrative   Not on file   Social Determinants of Health   Financial Resource Strain: Low Risk  (12/14/2022)   Overall Financial Resource Strain (CARDIA)    Difficulty of Paying Living Expenses: Not hard at all  Food Insecurity: No Food Insecurity (12/14/2022)   Hunger Vital Sign    Worried About Running  Out of Food in the Last Year: Never true    Ran Out of Food in the Last Year: Never true  Transportation Needs: No Transportation Needs (12/14/2022)   PRAPARE - Administrator, Civil Service (Medical): No    Lack of Transportation (Non-Medical): No  Physical Activity: Sufficiently Active (12/14/2022)   Exercise Vital Sign    Days of Exercise per Week: 7 days    Minutes of Exercise per Session: 90 min  Stress: No Stress Concern Present (12/14/2022)   [employer] of Occupational Health - Occupational Stress Questionnaire    Feeling of Stress : Not at all  Social Connections: Unknown (12/14/2022)   Social Connection and Isolation Panel [NHANES]    Frequency of Communication with Friends and Family: More than three times a week    Frequency of Social Gatherings with Friends and Family: Once a week    Attends Religious Services: Not on Marketing executive or Organizations: No    Attends Engineer, structural: Not on file    Marital Status: Divorced    Tobacco Counseling Counseling given: Not Answered   Clinical Intake:  Pre-visit preparation completed: Yes  Pain : No/denies pain     BMI - recorded: 35.15 Nutritional Risks: None Diabetes: No  How often do you need to have someone help you when you read instructions, pamphlets, or other written materials from your doctor or pharmacy?: 1 - Never  Interpreter Needed?: No  Information entered by :: Theresa Mulligan LPN   Activities of Daily Living    12/14/2022   12:22 PM  In your present state of health, do you have any difficulty performing the following activities:  Hearing? 0  Vision? 0  Difficulty concentrating or making decisions? 0  Walking or climbing stairs? 0  Dressing or bathing? 0  Doing errands, shopping? 0  Preparing Food and eating ? N  Using the Toilet? N  In the past six months, have you accidently leaked urine? N  Do you have problems with loss of bowel control? N   Managing your Medications? N  Managing your Finances? N  Housekeeping or managing your Housekeeping? N    Patient Care Team: Avanell Shackleton, NP-C as PCP - General (Family Medicine)  Indicate any recent Medical Services you may have received from other than [facility] providers in the past year (date may be approximate).     Assessment:   This is a routine wellness examination for Sean Cabrera.  Hearing/Vision screen Hearing Screening - Comments:: Denies hearing difficulties   Vision Screening - Comments:: Wears rx glasses - up to date with routine eye exams with [facility]    Goals Addressed               This Visit's Progress     Continue to Travel (pt-stated)  Depression Screen    12/15/2022   11:12 AM 05/27/2022    3:56 PM  PHQ 2/9 Scores  PHQ - 2 Score 0 0    Fall Risk    12/15/2022   11:14 AM 12/14/2022   12:22 PM 05/27/2022    3:55 PM  Fall Risk   Falls in the past year? 0 0 0  Number falls in past yr: 0 0 0  Injury with Fall? 0 0 0  Risk for fall due to : No Fall Risks  No Fall Risks  Follow up Falls prevention discussed  Falls evaluation completed    MEDICARE RISK AT HOME: Medicare Risk at Home Any stairs in or around the home?: Yes If so, are there any without handrails?: Yes Home free of loose throw rugs in walkways, pet beds, electrical cords, etc?: Yes Adequate lighting in your home to reduce risk of falls?: Yes Life alert?: No Use of a cane, walker or w/c?: No Grab bars in the bathroom?: No Shower chair or bench in shower?: No Elevated toilet seat or a handicapped toilet?: No  TIMED UP AND GO:  Was the test performed?  No    Cognitive Function:        12/15/2022   11:15 AM  6CIT Screen  What Year? 0 points  What month? 0 points  What time? 0 points  Count back from 20 0 points  Months in reverse 0 points  Repeat phrase 0 points  Total Score 0 points    Immunizations Immunization History  Administered Date(s) Administered    Influenza-Unspecified 03/23/2013, 01/02/2014, 12/23/2016, 01/18/2018, 01/02/2019, 01/29/2020, 12/18/2020, 01/08/2022   Moderna Sars-Covid-2 Vaccination 04/07/2019, 05/05/2019, 02/10/2020, 07/02/2020, 12/30/2020   Pneumococcal Conjugate PCV 7 04/03/2014   Pneumococcal Polysaccharide-23 07/20/2012, 04/03/2014   Rsv, Bivalent, Protein Subunit Rsvpref,pf Verdis Frederickson) 01/08/2022, 03/03/2022   Zoster Recombinant(Shingrix) 06/13/2020, 12/18/2020   Zoster, Live 04/08/2012    TDAP status: Due, Education has been provided regarding the importance of this vaccine. Advised may receive this vaccine at local pharmacy or Health Dept. Aware to provide a copy of the vaccination record if obtained from local pharmacy or Health Dept. Verbalized acceptance and understanding.  Flu Vaccine status: Due, Education has been provided regarding the importance of this vaccine. Advised may receive this vaccine at local pharmacy or Health Dept. Aware to provide a copy of the vaccination record if obtained from local pharmacy or Health Dept. Verbalized acceptance and understanding.  Pneumococcal vaccine status: Due, Education has been provided regarding the importance of this vaccine. Advised may receive this vaccine at local pharmacy or Health Dept. Aware to provide a copy of the vaccination record if obtained from local pharmacy or Health Dept. Verbalized acceptance and understanding.  Covid-19 vaccine status: Declined, Education has been provided regarding the importance of this vaccine but patient still declined. Advised may receive this vaccine at local pharmacy or Health Dept.or vaccine clinic. Aware to provide a copy of the vaccination record if obtained from local pharmacy or Health Dept. Verbalized acceptance and understanding.  Qualifies for Shingles Vaccine? Yes   Zostavax completed Yes   Shingrix Completed?: Yes  Screening Tests Health Maintenance  Topic Date Due   Hepatitis C Screening  Never done   DTaP/Tdap/Td  (1 - Tdap) Never done   Pneumonia Vaccine 50+ Years old (2 of 2 - PCV) 04/04/2015   INFLUENZA VACCINE  10/08/2022   COVID-19 Vaccine (6 - 2023-24 season) 11/08/2022   Medicare Annual Wellness (AWV)  12/15/2023   Colonoscopy  12/08/2026   Zoster Vaccines- Shingrix  Completed   HPV VACCINES  Aged Out    Health Maintenance  Health Maintenance Due  Topic Date Due   Hepatitis C Screening  Never done   DTaP/Tdap/Td (1 - Tdap) Never done   Pneumonia Vaccine 38+ Years old (2 of 2 - PCV) 04/04/2015   INFLUENZA VACCINE  10/08/2022   COVID-19 Vaccine (6 - 2023-24 season) 11/08/2022    Colorectal cancer screening: Type of screening: Colonoscopy. Completed 12/07/16. Repeat every 10 years   Additional Screening:  Hepatitis C Screening: does qualify;  [facility]  Vision Screening: Recommended annual ophthalmology exams for early detection of glaucoma and other disorders of the eye. Is the patient up to date with their annual eye exam?  Yes  Who is the provider or what is the name of the office in which the patient attends annual eye exams? [facility] If pt is not established with a provider, would they like to be referred to a provider to establish care? No .   Dental Screening: Recommended annual dental exams for proper oral hygiene   Community Resource Referral / Chronic Care Management:  CRR required this visit?  No   CCM required this visit?  No     Plan:     I have personally reviewed and noted the following in the patient's chart:   Medical and social history Use of alcohol, tobacco or illicit drugs  Current medications and supplements including opioid prescriptions. Patient is not currently taking opioid prescriptions. Functional ability and status Nutritional status Physical activity Advanced directives List of other physicians Hospitalizations, surgeries, and ER visits in previous 12 months Vitals Screenings to include cognitive, depression, and falls Referrals and  appointments  In addition, I have reviewed and discussed with patient certain preventive protocols, quality metrics, and best practice recommendations. A written personalized care plan for preventive services as well as general preventive health recommendations were provided to patient.     Tillie Rung, LPN   29/07/2839   After Visit Summary: (MyChart) Due to this being a telephonic visit, the after visit summary with patients personalized plan was offered to patient via MyChart   Nurse Notes: None

## 2022-12-15 NOTE — Patient Instructions (Addendum)
Mr. Serviss , Thank you for taking time to come for your Medicare Wellness Visit. I appreciate your ongoing commitment to your health goals. Please review the following plan we discussed and let me know if I can assist you in the future.   Referrals/Orders/Follow-Ups/Clinician Recommendations:   This is a list of the screening recommended for you and due dates:  Health Maintenance  Topic Date Due   Hepatitis C Screening  Never done   DTaP/Tdap/Td vaccine (1 - Tdap) Never done   Pneumonia Vaccine (2 of 2 - PCV) 04/04/2015   Flu Shot  10/08/2022   COVID-19 Vaccine (6 - 2023-24 season) 11/08/2022   Medicare Annual Wellness Visit  12/15/2023   Colon Cancer Screening  12/08/2026   Zoster (Shingles) Vaccine  Completed   HPV Vaccine  Aged Out    Advanced directives: (Copy Requested) Please bring a copy of your health care power of attorney and living will to the office to be added to your chart at your convenience.  Next Medicare Annual Wellness Visit scheduled for next year: Yes

## 2022-12-29 ENCOUNTER — Other Ambulatory Visit (INDEPENDENT_AMBULATORY_CARE_PROVIDER_SITE_OTHER): Payer: Medicare Other

## 2022-12-29 DIAGNOSIS — E119 Type 2 diabetes mellitus without complications: Secondary | ICD-10-CM | POA: Diagnosis not present

## 2022-12-29 DIAGNOSIS — E782 Mixed hyperlipidemia: Secondary | ICD-10-CM

## 2022-12-29 DIAGNOSIS — M1A9XX0 Chronic gout, unspecified, without tophus (tophi): Secondary | ICD-10-CM

## 2022-12-29 DIAGNOSIS — I10 Essential (primary) hypertension: Secondary | ICD-10-CM | POA: Diagnosis not present

## 2022-12-29 LAB — COMPREHENSIVE METABOLIC PANEL
ALT: 14 U/L (ref 0–53)
AST: 23 U/L (ref 0–37)
Albumin: 4.6 g/dL (ref 3.5–5.2)
Alkaline Phosphatase: 51 U/L (ref 39–117)
BUN: 16 mg/dL (ref 6–23)
CO2: 25 meq/L (ref 19–32)
Calcium: 9.8 mg/dL (ref 8.4–10.5)
Chloride: 103 meq/L (ref 96–112)
Creatinine, Ser: 1.12 mg/dL (ref 0.40–1.50)
GFR: 64.24 mL/min (ref >60.00)
Glucose, Bld: 108 mg/dL — ABNORMAL HIGH (ref 70–99)
Potassium: 4.9 meq/L (ref 3.5–5.1)
Sodium: 138 meq/L (ref 135–145)
Total Bilirubin: 0.8 mg/dL (ref 0.2–1.2)
Total Protein: 7.9 g/dL (ref 6.0–8.3)

## 2022-12-29 LAB — CBC WITH DIFFERENTIAL/PLATELET
Basophils Absolute: 0 10*3/uL (ref 0.0–0.1)
Basophils Relative: 0.4 % (ref 0.0–3.0)
Eosinophils Absolute: 0.2 10*3/uL (ref 0.0–0.7)
Eosinophils Relative: 2.8 % (ref 0.0–5.0)
HCT: 46.3 % (ref 39.0–52.0)
Hemoglobin: 15.2 g/dL (ref 13.0–17.0)
Lymphocytes Relative: 48.8 % — ABNORMAL HIGH (ref 12.0–46.0)
Lymphs Abs: 2.9 10*3/uL (ref 0.7–4.0)
MCHC: 32.7 g/dL (ref 30.0–36.0)
MCV: 94.8 fL (ref 78.0–100.0)
Monocytes Absolute: 0.4 10*3/uL (ref 0.1–1.0)
Monocytes Relative: 6.7 % (ref 3.0–12.0)
Neutro Abs: 2.4 10*3/uL (ref 1.4–7.7)
Neutrophils Relative %: 41.3 % — ABNORMAL LOW (ref 43.0–77.0)
Platelets: 237 10*3/uL (ref 150.0–400.0)
RBC: 4.88 Mil/uL (ref 4.22–5.81)
RDW: 13.3 % (ref 11.5–15.5)
WBC: 5.9 10*3/uL (ref 4.0–10.5)

## 2022-12-29 LAB — LIPID PANEL
Cholesterol: 87 mg/dL (ref 0–200)
HDL: 36 mg/dL — ABNORMAL LOW (ref >39.00)
LDL Cholesterol: 32 mg/dL (ref 0–99)
NonHDL: 51.32
Total CHOL/HDL Ratio: 2
Triglycerides: 99 mg/dL (ref 0.0–149.0)
VLDL: 19.8 mg/dL (ref 0.0–40.0)

## 2022-12-29 LAB — TSH: TSH: 2.26 u[IU]/mL (ref 0.35–5.50)

## 2022-12-29 LAB — HEMOGLOBIN A1C: Hgb A1c MFr Bld: 6.4 % (ref 4.6–6.5)

## 2022-12-29 LAB — URIC ACID: Uric Acid, Serum: 6.8 mg/dL (ref 4.0–7.8)

## 2022-12-31 ENCOUNTER — Encounter: Payer: Self-pay | Admitting: Family Medicine

## 2022-12-31 ENCOUNTER — Ambulatory Visit: Payer: Medicare Other | Admitting: Family Medicine

## 2022-12-31 VITALS — BP 120/64 | HR 64 | Temp 97.6°F | Ht 71.0 in | Wt 259.0 lb

## 2022-12-31 DIAGNOSIS — E782 Mixed hyperlipidemia: Secondary | ICD-10-CM | POA: Diagnosis not present

## 2022-12-31 DIAGNOSIS — M1A9XX0 Chronic gout, unspecified, without tophus (tophi): Secondary | ICD-10-CM | POA: Diagnosis not present

## 2022-12-31 DIAGNOSIS — E119 Type 2 diabetes mellitus without complications: Secondary | ICD-10-CM | POA: Diagnosis not present

## 2022-12-31 DIAGNOSIS — I10 Essential (primary) hypertension: Secondary | ICD-10-CM | POA: Diagnosis not present

## 2022-12-31 DIAGNOSIS — E669 Obesity, unspecified: Secondary | ICD-10-CM

## 2022-12-31 HISTORY — DX: Type 2 diabetes mellitus without complications: E11.9

## 2022-12-31 NOTE — Assessment & Plan Note (Signed)
Work on [employer] and exercise. Follow up in 3 months.

## 2022-12-31 NOTE — Assessment & Plan Note (Signed)
LDL 32. Continue statin therapy. Eating mediterranean diet. Follow up with cardiologist as scheduled.

## 2022-12-31 NOTE — Progress Notes (Signed)
Subjective:     Patient ID: Sean Cabrera, male    DOB: 20-Mar-1945, 77 y.o.   MRN: [id number]  Chief Complaint  Patient presents with   Medical Management of Chronic Issues    HPI   History of Present Illness          Here for follow up on chronic health conditions.  A1c improved to 6.4%.   Plans to do intermittent fasting and work on healthy diet.   HLD- on statin therapy. Mediterranean diet.   HTN- taking valsartan Sees cardiology  No gout flares since last visit. On allopurinol.   States Tdap is UTD, [facility]   Health Maintenance Due  Topic Date Due   FOOT EXAM  Never done   OPHTHALMOLOGY EXAM  Never done   Diabetic kidney evaluation - Urine ACR  Never done   Hepatitis C Screening  Never done   DTaP/Tdap/Td (1 - Tdap) Never done   Pneumonia Vaccine 73+ Years old (2 of 2 - PCV) 04/04/2015    Past Medical History:  Diagnosis Date   Gout    High cholesterol    Hypertension    Sleep apnea    lost 30 lbs not used cpap since 2010   Type 2 diabetes mellitus without complication, without long-term current use of insulin (HCC) 12/31/2022    Past Surgical History:  Procedure Laterality Date   CYSTECTOMY     removed off left lung  benign   HERNIA REPAIR     INGUINAL HERNIA REPAIR Right 07/08/2016   Procedure: LAPAROSCOPIC RIGHT INGUINAL HERNIA REPAIR WITH MESH;  Surgeon: Axel Filler, MD;  Location: [facility] OR;  Service: General;  Laterality: Right;   INSERTION OF MESH Right 07/08/2016   Procedure: INSERTION OF MESH;  Surgeon: Axel Filler, MD;  Location: [facility] OR;  Service: General;  Laterality: Right;    Family History  Problem Relation Age of Onset   Hypertension Mother    Diabetes Father     Social History   Socioeconomic History   Marital status: Divorced    Spouse name: Not on file   Number of children: 2   Years of education: Not on file   Highest education level: Master's degree (e.g., MA, MS, MEng, MEd, MSW, MBA)  Occupational History    Not on file  Tobacco Use   Smoking status: Never   Smokeless tobacco: Never  Vaping Use   Vaping status: Never Used  Substance and Sexual Activity   Alcohol use: No   Drug use: No   Sexual activity: Not Currently  Other Topics Concern   Not on file  Social History Narrative   Not on file   Social Determinants of Health   Financial Resource Strain: Low Risk  (12/27/2022)   Overall Financial Resource Strain (CARDIA)    Difficulty of Paying Living Expenses: Not hard at all  Food Insecurity: No Food Insecurity (12/27/2022)   Hunger Vital Sign    Worried About Running Out of Food in the Last Year: Never true    Ran Out of Food in the Last Year: Never true  Transportation Needs: No Transportation Needs (12/27/2022)   PRAPARE - Administrator, Civil Service (Medical): No    Lack of Transportation (Non-Medical): No  Physical Activity: Sufficiently Active (12/27/2022)   Exercise Vital Sign    Days of Exercise per Week: 7 days    Minutes of Exercise per Session: 70 min  Stress: No Stress Concern Present (12/27/2022)   [place]  [employer] of [employer]ational [employer] - Occupational Stress Questionnaire    Feeling of Stress : Not at all  Social Connections: Moderately Isolated (12/27/2022)   Social Connection and Isolation Panel [NHANES]    Frequency of Communication with Friends and Family: More than three times a week    Frequency of Social Gatherings with Friends and Family: Twice a week    Attends Religious Services: More than 4 times per year    Active Member of [employer] or Organizations: No    Attends Engineer, structural: Not on file    Marital Status: Divorced  Intimate Partner Violence: Not At Risk (12/15/2022)   Humiliation, Afraid, Rape, and Kick questionnaire    Fear of Current or Ex-Partner: No    Emotionally Abused: No    Physically Abused: No    Sexually Abused: No    Outpatient Medications Prior to Visit  Medication Sig Dispense Refill   allopurinol  (ZYLOPRIM) 300 MG tablet TAKE 1 TABLET BY MOUTH EVERY DAY 90 tablet 1   aspirin EC 81 MG tablet Take 1 tablet (81 mg total) by mouth daily. Swallow whole. 30 tablet 12   rosuvastatin (CRESTOR) 10 MG tablet Take 1 tablet (10 mg total) by mouth daily. 90 tablet 3   valsartan (DIOVAN) 160 MG tablet Take 1 tablet (160 mg total) by mouth daily. 90 tablet 3   No facility-administered medications prior to visit.    No Known Allergies  Review of Systems  Constitutional:  Negative for chills, fever and malaise/fatigue.  Respiratory:  Negative for shortness of breath.   Cardiovascular:  Negative for chest pain, palpitations and leg swelling.  Gastrointestinal:  Negative for abdominal pain, constipation, diarrhea, nausea and vomiting.  Genitourinary:  Negative for dysuria, frequency and urgency.  Neurological:  Negative for dizziness and focal weakness.  Psychiatric/Behavioral:  Negative for depression. The patient is not nervous/anxious.        Objective:    Physical Exam Constitutional:      General: He is not in acute distress.    Appearance: He is not ill-appearing.  Eyes:     Extraocular Movements: Extraocular movements intact.     Conjunctiva/sclera: Conjunctivae normal.  Cardiovascular:     Rate and Rhythm: Normal rate and regular rhythm.  Pulmonary:     Effort: Pulmonary effort is normal.     Breath sounds: Normal breath sounds.  Musculoskeletal:     Cervical back: Normal range of motion and neck supple.     Right lower leg: No edema.     Left lower leg: No edema.  Skin:    General: Skin is warm and dry.  Neurological:     General: No focal deficit present.     Mental Status: He is alert and oriented to person, place, and time.     Motor: No weakness.     Coordination: Coordination normal.     Gait: Gait normal.  Psychiatric:        Mood and Affect: Mood normal.        Behavior: Behavior normal.        Thought Content: Thought content normal.      BP 120/64 (BP  Location: Left Arm, Patient Position: Sitting, Cuff Size: Large)   Pulse 64   Temp 97.6 F (36.4 C) (Temporal)   Ht 5\' 11"  (1.803 m)   Wt 259 lb (117.5 kg)   SpO2 100%   BMI 36.12 kg/m  Wt Readings from Last 3 Encounters:  12/31/22 259 lb (  117.5 kg)  12/15/22 252 lb (114.3 kg)  07/10/22 252 lb (114.3 kg)       Assessment & Plan:   Problem List Items Addressed This Visit       Cardiovascular and Mediastinum   Hypertension    On Valsartan. Monitor BP at home. Low sodium diet. Follow up with cardiology as scheduled.         Endocrine   Type 2 diabetes mellitus without complication, without long-term current use of insulin (HCC) - Primary    A1c 6.4%. diet controlled. Follow up in 3 months. Discussed trying GLP-1 in future         Other   Gout    No flares. Continue allopurinol.       Hyperlipemia    LDL 32. Continue statin therapy. Eating mediterranean diet. Follow up with cardiologist as scheduled.       Obesity (BMI 30-39.9)    Work on [employer] and exercise. Follow up in 3 months.        I am having Tesla Santiago maintain his valsartan, aspirin EC, rosuvastatin, and allopurinol.  No orders of the defined types were placed in this encounter.

## 2022-12-31 NOTE — Assessment & Plan Note (Signed)
On Valsartan. Monitor BP at home. Low sodium diet. Follow up with cardiology as scheduled.

## 2022-12-31 NOTE — Assessment & Plan Note (Signed)
No flares. Continue allopurinol. 

## 2022-12-31 NOTE — Assessment & Plan Note (Signed)
A1c 6.4%. diet controlled. Follow up in 3 months. Discussed trying GLP-1 in future

## 2022-12-31 NOTE — Patient Instructions (Addendum)
Keep up the good work.   Check on weight loss medications we discussed. GLP-1 once weekly injections such as Mounjaro, Ozempic, Trulicity.

## 2023-02-17 ENCOUNTER — Other Ambulatory Visit: Payer: Self-pay | Admitting: Family Medicine

## 2023-02-17 ENCOUNTER — Telehealth: Payer: Self-pay | Admitting: Family Medicine

## 2023-02-17 DIAGNOSIS — M109 Gout, unspecified: Secondary | ICD-10-CM

## 2023-02-17 DIAGNOSIS — Z1159 Encounter for screening for other viral diseases: Secondary | ICD-10-CM

## 2023-02-17 DIAGNOSIS — I10 Essential (primary) hypertension: Secondary | ICD-10-CM

## 2023-02-17 DIAGNOSIS — E782 Mixed hyperlipidemia: Secondary | ICD-10-CM

## 2023-02-17 DIAGNOSIS — E119 Type 2 diabetes mellitus without complications: Secondary | ICD-10-CM

## 2023-02-17 NOTE — Telephone Encounter (Signed)
F/u with PCP scheduled 1/24, lab appt scheduled for 1/20.  Please enter lab orders for this visit.

## 2023-02-18 NOTE — Telephone Encounter (Signed)
Called pt, [place] so we r/s visits to March

## 2023-02-18 NOTE — Telephone Encounter (Signed)
Can you reach out to patient to reschedule lab visit per PCP.

## 2023-03-29 ENCOUNTER — Other Ambulatory Visit: Payer: Medicare Other

## 2023-04-02 ENCOUNTER — Ambulatory Visit: Payer: Medicare Other | Admitting: Family Medicine

## 2023-04-06 ENCOUNTER — Ambulatory Visit: Payer: Medicare Other | Admitting: Cardiology

## 2023-05-10 ENCOUNTER — Other Ambulatory Visit: Payer: Self-pay | Admitting: Cardiology

## 2023-05-17 ENCOUNTER — Other Ambulatory Visit (INDEPENDENT_AMBULATORY_CARE_PROVIDER_SITE_OTHER): Payer: Medicare Other

## 2023-05-17 DIAGNOSIS — Z1159 Encounter for screening for other viral diseases: Secondary | ICD-10-CM

## 2023-05-17 DIAGNOSIS — M109 Gout, unspecified: Secondary | ICD-10-CM

## 2023-05-17 DIAGNOSIS — E782 Mixed hyperlipidemia: Secondary | ICD-10-CM

## 2023-05-17 DIAGNOSIS — E119 Type 2 diabetes mellitus without complications: Secondary | ICD-10-CM

## 2023-05-17 LAB — CBC WITH DIFFERENTIAL/PLATELET
Basophils Absolute: 0 10*3/uL (ref 0.0–0.1)
Basophils Relative: 0.2 % (ref 0.0–3.0)
Eosinophils Absolute: 0.1 10*3/uL (ref 0.0–0.7)
Eosinophils Relative: 1.4 % (ref 0.0–5.0)
HCT: 46.3 % (ref 39.0–52.0)
Hemoglobin: 15.4 g/dL (ref 13.0–17.0)
Lymphocytes Relative: 48.2 % — ABNORMAL HIGH (ref 12.0–46.0)
Lymphs Abs: 2.9 10*3/uL (ref 0.7–4.0)
MCHC: 33.4 g/dL (ref 30.0–36.0)
MCV: 95.2 fl (ref 78.0–100.0)
Monocytes Absolute: 0.5 10*3/uL (ref 0.1–1.0)
Monocytes Relative: 8 % (ref 3.0–12.0)
Neutro Abs: 2.5 10*3/uL (ref 1.4–7.7)
Neutrophils Relative %: 42.2 % — ABNORMAL LOW (ref 43.0–77.0)
Platelets: 269 10*3/uL (ref 150.0–400.0)
RBC: 4.86 Mil/uL (ref 4.22–5.81)
RDW: 14 % (ref 11.5–15.5)
WBC: 6 10*3/uL (ref 4.0–10.5)

## 2023-05-17 LAB — COMPREHENSIVE METABOLIC PANEL
ALT: 14 U/L (ref 0–53)
AST: 18 U/L (ref 0–37)
Albumin: 4.8 g/dL (ref 3.5–5.2)
Alkaline Phosphatase: 36 U/L — ABNORMAL LOW (ref 39–117)
BUN: 41 mg/dL — ABNORMAL HIGH (ref 6–23)
CO2: 27 meq/L (ref 19–32)
Calcium: 9.8 mg/dL (ref 8.4–10.5)
Chloride: 98 meq/L (ref 96–112)
Creatinine, Ser: 1.49 mg/dL (ref 0.40–1.50)
GFR: 45.48 mL/min — ABNORMAL LOW (ref >60.00)
Glucose, Bld: 103 mg/dL — ABNORMAL HIGH (ref 70–99)
Potassium: 4.9 meq/L (ref 3.5–5.1)
Sodium: 134 meq/L — ABNORMAL LOW (ref 135–145)
Total Bilirubin: 0.9 mg/dL (ref 0.2–1.2)
Total Protein: 7.9 g/dL (ref 6.0–8.3)

## 2023-05-17 LAB — LIPID PANEL
Cholesterol: 144 mg/dL (ref 0–200)
HDL: 46.1 mg/dL (ref >39.00)
LDL Cholesterol: 85 mg/dL (ref 0–99)
NonHDL: 97.95
Total CHOL/HDL Ratio: 3
Triglycerides: 64 mg/dL (ref 0.0–149.0)
VLDL: 12.8 mg/dL (ref 0.0–40.0)

## 2023-05-17 LAB — MICROALBUMIN / CREATININE URINE RATIO
Creatinine,U: 99.3 mg/dL
Microalb Creat Ratio: 7 mg/g (ref 0.0–30.0)
Microalb, Ur: <0.7 mg/dL (ref 0.0–1.9)

## 2023-05-17 LAB — URIC ACID: Uric Acid, Serum: 7.3 mg/dL (ref 4.0–7.8)

## 2023-05-17 LAB — HEMOGLOBIN A1C: Hgb A1c MFr Bld: 6.2 % (ref 4.6–6.5)

## 2023-05-18 ENCOUNTER — Encounter: Payer: Self-pay | Admitting: Family Medicine

## 2023-05-18 LAB — HEPATITIS C ANTIBODY: Hepatitis C Ab: NONREACTIVE

## 2023-05-18 NOTE — Progress Notes (Signed)
 He has an appt with me Friday. Please ask him to come in well hydrated so I can recheck his kidney function. He does not need to fast.

## 2023-05-19 ENCOUNTER — Other Ambulatory Visit: Payer: Self-pay | Admitting: Family Medicine

## 2023-05-19 DIAGNOSIS — R944 Abnormal results of kidney function studies: Secondary | ICD-10-CM

## 2023-05-19 NOTE — Progress Notes (Signed)
I will order the labs

## 2023-05-20 NOTE — Progress Notes (Signed)
 Cardiology Office Note:    Date:  05/24/2023   ID:  Sean Cabrera, DOB 07-Feb-1946, MRN [id number]  PCP:  Avanell Shackleton, NP-C    [facility]Care Providers Cardiologist:  None     Referring MD: Avanell Shackleton, NP-C   Chief Complaint  Patient presents with   Hyperlipidemia   Hypertension    History of Present Illness:    Sean Cabrera is a 78 y.o. male is seen for follow up. He has a history of HTN, HLD, and  history of OSA on CPAP. His ex wife Sean Cabrera works in our [employer]. He reports he was abroad in [place] and had a cold. He developed a pain below his left scapula. Denied any cough, fever or other chest pain. Was seen by a cardiologist. Ecg was normal. Echo done showed aortic dilation 4.2 cm and mild AI. LV function was OK. He states since then the thoracic pain has become less but is still there. He states it is worse when he is upright or with twisting. Was seen at Urgent care. CXR suggested possible PNA so he was treated with antibiotics. Now he states the pain is 1/10. He otherwise feels well. Notes BP has been consistently in the 135-140 systolic range at home. Reports he had a stress test 8 years ago that was OK. We performed CT chest which showed aortic dimension of 4 cm. No acute abnormality. Coronary calcification. Myoview study was normal.   On follow up today he is doing very well. He denies any chest pain, dyspnea, palpitations or dizziness. Travels back and forth to [place]. Maintaining weight.    Past Medical History:  Diagnosis Date   Gout    High cholesterol    Hypertension    Sleep apnea    lost 30 lbs not used cpap since 2010   Type 2 diabetes mellitus without complication, without long-term current use of insulin (HCC) 12/31/2022    Past Surgical History:  Procedure Laterality Date   CYSTECTOMY     removed off left lung  benign   HERNIA REPAIR     INGUINAL HERNIA REPAIR Right 07/08/2016   Procedure: LAPAROSCOPIC RIGHT INGUINAL HERNIA  REPAIR WITH MESH;  Surgeon: Axel Filler, MD;  Location: [facility] OR;  Service: General;  Laterality: Right;   INSERTION OF MESH Right 07/08/2016   Procedure: INSERTION OF MESH;  Surgeon: Axel Filler, MD;  Location: [facility] OR;  Service: General;  Laterality: Right;    Current Medications: Current Meds  Medication Sig   allopurinol (ZYLOPRIM) 300 MG tablet TAKE 1 TABLET BY MOUTH EVERY DAY   aspirin EC 81 MG tablet Take 1 tablet (81 mg total) by mouth daily. Swallow whole.   rosuvastatin (CRESTOR) 20 MG tablet Take 1 tablet (20 mg total) by mouth daily.   valsartan (DIOVAN) 160 MG tablet TAKE 1 TABLET BY MOUTH EVERY DAY     Allergies:   Patient has no known allergies.   Social History   Socioeconomic History   Marital status: Divorced    Spouse name: Not on file   Number of children: 2   Years of education: Not on file   Highest education level: Master's degree (e.g., MA, MS, MEng, MEd, MSW, MBA)  Occupational History   Not on file  Tobacco Use   Smoking status: Never   Smokeless tobacco: Never  Vaping Use   Vaping status: Never Used  Substance and Sexual Activity   Alcohol use: No   Drug use: No  Sexual activity: Not Currently    Partners: Female    Comment: Divorced  Other Topics Concern   Not on file  Social History Narrative   Not on file   Social Drivers of Health   Financial Resource Strain: Low Risk  (05/20/2023)   Overall Financial Resource Strain (CARDIA)    Difficulty of Paying Living Expenses: Not hard at all  Food Insecurity: No Food Insecurity (05/20/2023)   Hunger Vital Sign    Worried About Running Out of Food in the Last Year: Never true    Ran Out of Food in the Last Year: Never true  Transportation Needs: No Transportation Needs (05/20/2023)   PRAPARE - Administrator, Civil Service (Medical): No    Lack of Transportation (Non-Medical): No  Physical Activity: Sufficiently Active (05/20/2023)   Exercise Vital Sign    Days of Exercise per  Week: 7 days    Minutes of Exercise per Session: 70 min  Stress: No Stress Concern Present (05/20/2023)   [employer] of Occupational Health - Occupational Stress Questionnaire    Feeling of Stress : Not at all  Social Connections: Moderately Integrated (05/20/2023)   Social Connection and Isolation Panel [NHANES]    Frequency of Communication with Friends and Family: More than three times a week    Frequency of Social Gatherings with Friends and Family: Once a week    Attends Religious Services: More than 4 times per year    Active Member of [employer] or Organizations: Yes    Attends Engineer, structural: More than 4 times per year    Marital Status: Divorced     Family History: The patient's family history includes Diabetes in his father; Hypertension in his mother.  ROS:   Please see the history of present illness.     All other systems reviewed and are negative.  EKGs/Labs/Other Studies Reviewed:    The following studies were reviewed today: CT ANGIOGRAPHY CHEST WITH CONTRAST   TECHNIQUE: Multidetector CT imaging of the chest was performed using the standard protocol during bolus administration of intravenous contrast. Multiplanar CT image reconstructions and MIPs were obtained to evaluate the vascular anatomy.   RADIATION DOSE REDUCTION: This exam was performed according to the departmental dose-optimization program which includes automated exposure control, adjustment of the mA and/or kV according to patient size and/or use of iterative reconstruction technique.   CONTRAST:  75mL OMNIPAQUE IOHEXOL 350 MG/ML SOLN   COMPARISON:  None Available.   FINDINGS: Cardiovascular: No cardiomegaly or pericardial effusion. Atheromatous changes coronary arteries. Ascending thoracic aorta maximum diameter 4 cm. No dissection. No filling defects in pulmonary arteries to indicate PE.   Mediastinum/Nodes: No enlarged mediastinal, hilar, or axillary lymph nodes. Thyroid  gland, trachea, and esophagus demonstrate no significant findings.   Lungs/Pleura: Postop changes left upper hemithorax with parenchymal suture. Lungs are clear. No pneumothorax or pleural effusion.   Upper Abdomen: Left kidney 3 cm cyst which does not need to be followed up. Otherwise unremarkable.   Musculoskeletal: Thoracic degenerative changes. Minimal bilateral gynecomastia.   Review of the MIP images confirms the above findings.   IMPRESSION: 1. Ectatic ascending thoracic aorta, 4 cm maximum diameter. 2. No dissection or PE. 3. Left kidney cyst. 4. Minimal bilateral gynecomastia.     Electronically Signed   By: Layla Maw M.D.   On: 06/11/2022 16:42     Myoview 06/23/22:  Study Highlights      The study is normal. The study is low  risk.   No ST deviation was noted.   LV perfusion is normal. There is no evidence of ischemia. There is no evidence of infarction.   Left ventricular function is normal. Nuclear stress EF: 63 %. The left ventricular ejection fraction is normal (55-65%). End diastolic cavity size is normal. End systolic cavity size is normal.   Prior study not available for comparison.   Normal stress nuclear study with no ischemia or infarction.  Gated ejection fraction 63% with normal wall motion.  EKG Interpretation Date/Time:  Monday May 24 2023 08:24:00 EDT Ventricular Rate:  82 PR Interval:  150 QRS Duration:  84 QT Interval:  362 QTC Calculation: 422 R Axis:   -7  Text Interpretation: Normal sinus rhythm Normal ECG When compared with ECG of May 27, 2022 No significant change was found Confirmed by [place], Isis Costanza [phone]) on 05/24/2023 8:29:44 AM   Recent Labs: 12/29/2022: TSH 2.26 05/17/2023: ALT 14; Hemoglobin 15.4; Platelets 269.0 05/21/2023: BUN 32; Creatinine, Ser 1.36; Potassium 5.1; Sodium 134  Recent Lipid Panel    Component Value Date/Time   CHOL 144 05/17/2023 0929   CHOL 95 (L) 07/10/2022 0911   TRIG 64.0 05/17/2023 0929    HDL 46.10 05/17/2023 0929   HDL 44 07/10/2022 0911   CHOLHDL 3 05/17/2023 0929   VLDL 12.8 05/17/2023 0929   LDLCALC 85 05/17/2023 0929   LDLCALC 36 07/10/2022 0911     Risk Assessment/Calculations:                Physical Exam:    VS:  BP 130/70   Pulse 82   Ht 5\' 11"  (1.803 m)   Wt 251 lb 6.4 oz (114 kg)   SpO2 95%   BMI 35.06 kg/m     Wt Readings from Last 3 Encounters:  05/24/23 251 lb 6.4 oz (114 kg)  05/21/23 250 lb (113.4 kg)  12/31/22 259 lb (117.5 kg)     GEN:  Well nourished, overweight in no acute distress HEENT: Normal NECK: No JVD; No carotid bruits LYMPHATICS: No lymphadenopathy CARDIAC: RRR, very soft flow murmur LSB,  rubs, gallops RESPIRATORY:  Clear to auscultation without rales, wheezing or rhonchi  ABDOMEN: Soft, non-tender, non-distended MUSCULOSKELETAL:  No edema; No deformity  SKIN: Warm and dry NEUROLOGIC:  Alert and oriented x 3 PSYCHIATRIC:  Normal affect   ASSESSMENT:    1. Primary hypertension   2. Coronary artery calcification      PLAN:    In order of problems listed above:  Thoracic aortic aneurysm based on Echo 4.2 cm.repeat CT showed 4 cm aorta. Myoview study was normal.  BP control is optimal. Would repeat CT in 3 years. HTN. Excellent control on valsartan. Continue lifestyle modification.  HLD. Mixed On Crestor 10 mg daily. He does have significant coronary calcification.  Goal LDL < 70. Will increase Crestor to 20 mg daily Coronary calcification. Normal Myoview. Focus on risk factor modification.    I will follow up in one year   Medication Adjustments/Labs and Tests Ordered: Current medicines are reviewed at length with the patient today.  Concerns regarding medicines are outlined above.  Orders Placed This Encounter  Procedures   EKG 12-Lead   Meds ordered this encounter  Medications   rosuvastatin (CRESTOR) 20 MG tablet    Sig: Take 1 tablet (20 mg total) by mouth daily.    Dispense:  90 tablet    Refill:   3    There are no Patient Instructions on file  for this visit.   Signed, Emry Tobin [place], MD  05/24/2023 8:38 AM    [facility]

## 2023-05-21 ENCOUNTER — Other Ambulatory Visit (INDEPENDENT_AMBULATORY_CARE_PROVIDER_SITE_OTHER)

## 2023-05-21 ENCOUNTER — Ambulatory Visit: Payer: Medicare Other | Admitting: Family Medicine

## 2023-05-21 ENCOUNTER — Encounter: Payer: Self-pay | Admitting: Family Medicine

## 2023-05-21 VITALS — BP 118/64 | HR 68 | Temp 97.6°F | Ht 71.0 in | Wt 250.0 lb

## 2023-05-21 DIAGNOSIS — M1A9XX0 Chronic gout, unspecified, without tophus (tophi): Secondary | ICD-10-CM | POA: Diagnosis not present

## 2023-05-21 DIAGNOSIS — R944 Abnormal results of kidney function studies: Secondary | ICD-10-CM

## 2023-05-21 DIAGNOSIS — E782 Mixed hyperlipidemia: Secondary | ICD-10-CM

## 2023-05-21 DIAGNOSIS — E119 Type 2 diabetes mellitus without complications: Secondary | ICD-10-CM | POA: Diagnosis not present

## 2023-05-21 DIAGNOSIS — E669 Obesity, unspecified: Secondary | ICD-10-CM

## 2023-05-21 DIAGNOSIS — M25572 Pain in left ankle and joints of left foot: Secondary | ICD-10-CM

## 2023-05-21 DIAGNOSIS — I10 Essential (primary) hypertension: Secondary | ICD-10-CM

## 2023-05-21 DIAGNOSIS — Z6834 Body mass index (BMI) 34.0-34.9, adult: Secondary | ICD-10-CM

## 2023-05-21 LAB — BASIC METABOLIC PANEL
BUN: 32 mg/dL — ABNORMAL HIGH (ref 6–23)
CO2: 25 meq/L (ref 19–32)
Calcium: 9.6 mg/dL (ref 8.4–10.5)
Chloride: 101 meq/L (ref 96–112)
Creatinine, Ser: 1.36 mg/dL (ref 0.40–1.50)
GFR: 50.75 mL/min — ABNORMAL LOW (ref >60.00)
Glucose, Bld: 99 mg/dL (ref 70–99)
Potassium: 5.1 meq/L (ref 3.5–5.1)
Sodium: 134 meq/L — ABNORMAL LOW (ref 135–145)

## 2023-05-21 NOTE — Patient Instructions (Addendum)
 Please schedule your diabetes eye exam.   For your left foot/ankle- try using over the counter Voltaren gel. Elevate and ice your ankle/foot.  Let me know if you would like a referral to a podiatrist.

## 2023-05-21 NOTE — Progress Notes (Signed)
 Subjective:     Patient ID: Sean Cabrera, male    DOB: 08-18-1945, 78 y.o.   MRN: [id number]  Chief Complaint  Patient presents with   Medical Management of Chronic Issues    3 month f/u    HPI  History of Present Illness          Here to follow up on chronic health conditions.  Taking statin daily   Sees Dr. [place] Monday   He has lost 9 lbs since last visit. Intermittent fasting. Does not eat after 3 pm.   Overdue for eye exam.   Health Maintenance Due  Topic Date Due   OPHTHALMOLOGY EXAM  Never done   Pneumonia Vaccine 95+ Years old (2 of 2 - PCV) 04/04/2015    Past Medical History:  Diagnosis Date   Gout    High cholesterol    Hypertension    Sleep apnea    lost 30 lbs not used cpap since 2010   Type 2 diabetes mellitus without complication, without long-term current use of insulin (HCC) 12/31/2022    Past Surgical History:  Procedure Laterality Date   CYSTECTOMY     removed off left lung  benign   HERNIA REPAIR     INGUINAL HERNIA REPAIR Right 07/08/2016   Procedure: LAPAROSCOPIC RIGHT INGUINAL HERNIA REPAIR WITH MESH;  Surgeon: Axel Filler, MD;  Location: [facility];  Service: General;  Laterality: Right;   INSERTION OF MESH Right 07/08/2016   Procedure: INSERTION OF MESH;  Surgeon: Axel Filler, MD;  Location: [facility] OR;  Service: General;  Laterality: Right;    Family History  Problem Relation Age of Onset   Hypertension Mother    Diabetes Father     Social History   Socioeconomic History   Marital status: Divorced    Spouse name: Not on file   Number of children: 2   Years of education: Not on file   Highest education level: Master's degree (e.g., MA, MS, MEng, MEd, MSW, MBA)  Occupational History   Not on file  Tobacco Use   Smoking status: Never   Smokeless tobacco: Never  Vaping Use   Vaping status: Never Used  Substance and Sexual Activity   Alcohol use: No   Drug use: No   Sexual activity: Not Currently    Partners:  Female    Comment: Divorced  Other Topics Concern   Not on file  Social History Narrative   Not on file   Social Drivers of Health   Financial Resource Strain: Low Risk  (05/20/2023)   Overall Financial Resource Strain (CARDIA)    Difficulty of Paying Living Expenses: Not hard at all  Food Insecurity: No Food Insecurity (05/20/2023)   Hunger Vital Sign    Worried About Running Out of Food in the Last Year: Never true    Ran Out of Food in the Last Year: Never true  Transportation Needs: No Transportation Needs (05/20/2023)   PRAPARE - Administrator, Civil Service (Medical): No    Lack of Transportation (Non-Medical): No  Physical Activity: Sufficiently Active (05/20/2023)   Exercise Vital Sign    Days of Exercise per Week: 7 days    Minutes of Exercise per Session: 70 min  Stress: No Stress Concern Present (05/20/2023)   [employer] of Occupational Health - Occupational Stress Questionnaire    Feeling of Stress : Not at all  Social Connections: Moderately Integrated (05/20/2023)   Social Connection and Isolation Panel [NHANES]  Frequency of Communication with Friends and Family: More than three times a week    Frequency of Social Gatherings with Friends and Family: Once a week    Attends Religious Services: More than 4 times per year    Active Member of [employer] or Organizations: Yes    Attends Engineer, structural: More than 4 times per year    Marital Status: Divorced  Intimate Partner Violence: Not At Risk (12/15/2022)   Humiliation, Afraid, Rape, and Kick questionnaire    Fear of Current or Ex-Partner: No    Emotionally Abused: No    Physically Abused: No    Sexually Abused: No    Outpatient Medications Prior to Visit  Medication Sig Dispense Refill   allopurinol (ZYLOPRIM) 300 MG tablet TAKE 1 TABLET BY MOUTH EVERY DAY 90 tablet 1   aspirin EC 81 MG tablet Take 1 tablet (81 mg total) by mouth daily. Swallow whole. 30 tablet 12   valsartan  (DIOVAN) 160 MG tablet TAKE 1 TABLET BY MOUTH EVERY DAY 90 tablet 0   rosuvastatin (CRESTOR) 10 MG tablet Take 1 tablet (10 mg total) by mouth daily. 90 tablet 3   No facility-administered medications prior to visit.    No Known Allergies  Review of Systems  Constitutional:  Positive for weight loss. Negative for chills, fever and malaise/fatigue.       Intentional   Respiratory:  Negative for shortness of breath.   Cardiovascular:  Negative for chest pain, palpitations and leg swelling.  Gastrointestinal:  Negative for abdominal pain, constipation, diarrhea, nausea and vomiting.  Genitourinary:  Negative for dysuria, frequency and urgency.  Musculoskeletal:  Positive for joint pain. Negative for falls.       Left ankle/foot  Neurological:  Negative for dizziness, focal weakness and headaches.       Objective:    Physical Exam Constitutional:      General: He is not in acute distress.    Appearance: He is not ill-appearing.  Eyes:     Extraocular Movements: Extraocular movements intact.     Conjunctiva/sclera: Conjunctivae normal.  Cardiovascular:     Rate and Rhythm: Normal rate.  Pulmonary:     Effort: Pulmonary effort is normal.  Musculoskeletal:     Cervical back: Normal range of motion and neck supple.     Right lower leg: No edema.     Comments: Foot exam done and normal  Left lateral ankle edema   Skin:    General: Skin is warm and dry.  Neurological:     General: No focal deficit present.     Mental Status: He is alert and oriented to person, place, and time.  Psychiatric:        Mood and Affect: Mood normal.        Behavior: Behavior normal.        Thought Content: Thought content normal.      BP 118/64 (BP Location: Left Arm, Patient Position: Sitting, Cuff Size: Large)   Pulse 68   Temp 97.6 F (36.4 C) (Temporal)   Ht 5\' 11"  (1.803 m)   Wt 250 lb (113.4 kg)   SpO2 100%   BMI 34.87 kg/m  Wt Readings from Last 3 Encounters:  05/24/23 251 lb 6.4  oz (114 kg)  05/21/23 250 lb (113.4 kg)  12/31/22 259 lb (117.5 kg)       Assessment & Plan:   Problem List Items Addressed This Visit     Gout   Hyperlipemia  Hypertension   Obesity (BMI 30-39.9)   Type 2 diabetes mellitus without complication, without long-term current use of insulin (HCC) - Primary   Other Visit Diagnoses       Decreased GFR         Acute left ankle pain          Reviewed lab results. Elevated BUN. Most likely r/t not being hydrated. I have asked him to come in today better hydrated and we will recheck BMP.  A1c 6.2% not on diabetic pharmacologic therapy. He is on a statin.  Continue current medications.  Recommend eye exam.  Discussed RICE and Voltaren gel for left ankle edema. He will let me know if he would like a referral to podiatry.   I am having Sean Cabrera maintain his aspirin EC, allopurinol, and valsartan.  No orders of the defined types were placed in this encounter.

## 2023-05-24 ENCOUNTER — Encounter: Payer: Self-pay | Admitting: Cardiology

## 2023-05-24 ENCOUNTER — Encounter: Payer: Self-pay | Admitting: Family Medicine

## 2023-05-24 ENCOUNTER — Ambulatory Visit: Payer: Medicare Other | Attending: Cardiology | Admitting: Cardiology

## 2023-05-24 VITALS — BP 130/70 | HR 82 | Ht 71.0 in | Wt 251.4 lb

## 2023-05-24 DIAGNOSIS — I2510 Atherosclerotic heart disease of native coronary artery without angina pectoris: Secondary | ICD-10-CM | POA: Diagnosis present

## 2023-05-24 DIAGNOSIS — I10 Essential (primary) hypertension: Secondary | ICD-10-CM | POA: Insufficient documentation

## 2023-05-24 MED ORDER — ROSUVASTATIN CALCIUM 20 MG PO TABS: 20.0000 mg | ORAL_TABLET | Freq: Every day | ORAL | 3 refills | Status: AC

## 2023-05-24 NOTE — Patient Instructions (Signed)
 Medication Instructions:  Increase Rosuvastatin to 20 mg daily Continue all other medications *If you need a refill on your cardiac medications before your next appointment, please call your pharmacy*   Lab Work: Have lab as planned with PCP   Testing/Procedures: None ordered   Follow-Up: At [facility], you and your health needs are our priority.  As part of our continuing mission to provide you with exceptional heart care, we have created designated Provider Care Teams.  These Care Teams include your primary Cardiologist (physician) and Advanced Practice Providers (APPs -  Physician Assistants and Nurse Practitioners) who all work together to provide you with the care you need, when you need it.  We recommend signing up for the patient portal called "MyChart".  Sign up information is provided on this After Visit Summary.  MyChart is used to connect with patients for Virtual Visits (Telemedicine).  Patients are able to view lab/test results, encounter notes, upcoming appointments, etc.  Non-urgent messages can be sent to your provider as well.   To learn more about what you can do with MyChart, go to [email or web address].    Your next appointment:   1 year   Call in Dec to schedule March appointment     Provider:  Dr.Jordan

## 2023-05-25 ENCOUNTER — Other Ambulatory Visit: Payer: Self-pay | Admitting: Family Medicine

## 2023-05-25 MED ORDER — EMPAGLIFLOZIN 10 MG PO TABS: 10.0000 mg | ORAL_TABLET | Freq: Every day | ORAL | 1 refills | Status: DC

## 2023-05-26 NOTE — Telephone Encounter (Signed)
 Pt is wondering if you have any issues prescribing viagra or cialis?

## 2023-05-28 ENCOUNTER — Other Ambulatory Visit: Payer: Self-pay | Admitting: Family Medicine

## 2023-06-08 ENCOUNTER — Other Ambulatory Visit: Payer: Self-pay | Admitting: Family Medicine

## 2023-06-08 MED ORDER — COLCHICINE 0.6 MG PO TABS: 0.6000 mg | ORAL_TABLET | Freq: Two times a day (BID) | ORAL | 0 refills | Status: DC

## 2023-06-08 NOTE — Progress Notes (Unsigned)
   Subjective:    Patient ID: Sean Cabrera, male    DOB: 1945/11/23, 78 y.o.   MRN: [id number]  HPI    Review of Systems     Objective:   Physical Exam        Assessment & Plan:

## 2023-06-09 ENCOUNTER — Other Ambulatory Visit: Payer: Self-pay | Admitting: Family Medicine

## 2023-06-09 MED ORDER — SILDENAFIL CITRATE 50 MG PO TABS
50.0000 mg | ORAL_TABLET | Freq: Every day | ORAL | 0 refills | Status: AC | PRN
Start: 2023-06-09 — End: ?

## 2023-06-21 ENCOUNTER — Other Ambulatory Visit: Payer: Self-pay | Admitting: Family Medicine

## 2023-08-10 ENCOUNTER — Other Ambulatory Visit: Payer: Self-pay | Admitting: Cardiology

## 2023-08-21 ENCOUNTER — Other Ambulatory Visit: Payer: Self-pay | Admitting: Family Medicine

## 2023-09-20 ENCOUNTER — Other Ambulatory Visit: Payer: Self-pay | Admitting: Family Medicine

## 2023-09-21 MED ORDER — COLCHICINE 0.6 MG PO TABS: ORAL_TABLET | ORAL | 0 refills | Status: AC

## 2023-09-21 NOTE — Addendum Note (Signed)
 Addended by: Yennifer Segovia E on: 09/21/2023 02:38 PM   Modules accepted: Orders

## 2023-11-25 ENCOUNTER — Other Ambulatory Visit: Payer: Self-pay | Admitting: Family Medicine

## 2023-12-16 ENCOUNTER — Other Ambulatory Visit: Payer: Self-pay | Admitting: Family Medicine

## 2023-12-16 ENCOUNTER — Encounter: Payer: Self-pay | Admitting: Family Medicine

## 2023-12-16 DIAGNOSIS — Z125 Encounter for screening for malignant neoplasm of prostate: Secondary | ICD-10-CM

## 2023-12-16 DIAGNOSIS — E782 Mixed hyperlipidemia: Secondary | ICD-10-CM

## 2023-12-16 DIAGNOSIS — M1A9XX0 Chronic gout, unspecified, without tophus (tophi): Secondary | ICD-10-CM

## 2023-12-16 DIAGNOSIS — E669 Obesity, unspecified: Secondary | ICD-10-CM

## 2023-12-16 DIAGNOSIS — I10 Essential (primary) hypertension: Secondary | ICD-10-CM

## 2023-12-16 DIAGNOSIS — E1129 Type 2 diabetes mellitus with other diabetic kidney complication: Secondary | ICD-10-CM | POA: Insufficient documentation

## 2023-12-20 ENCOUNTER — Other Ambulatory Visit

## 2023-12-20 ENCOUNTER — Ambulatory Visit: Payer: Medicare Other

## 2023-12-20 DIAGNOSIS — E1129 Type 2 diabetes mellitus with other diabetic kidney complication: Secondary | ICD-10-CM

## 2023-12-20 DIAGNOSIS — E782 Mixed hyperlipidemia: Secondary | ICD-10-CM

## 2023-12-20 DIAGNOSIS — Z125 Encounter for screening for malignant neoplasm of prostate: Secondary | ICD-10-CM | POA: Diagnosis not present

## 2023-12-20 DIAGNOSIS — I10 Essential (primary) hypertension: Secondary | ICD-10-CM

## 2023-12-20 DIAGNOSIS — M1A9XX0 Chronic gout, unspecified, without tophus (tophi): Secondary | ICD-10-CM | POA: Diagnosis not present

## 2023-12-20 DIAGNOSIS — E669 Obesity, unspecified: Secondary | ICD-10-CM | POA: Diagnosis not present

## 2023-12-20 LAB — CBC WITH DIFFERENTIAL/PLATELET
Basophils Absolute: 0 K/uL (ref 0.0–0.1)
Basophils Relative: 0.3 % (ref 0.0–3.0)
Eosinophils Absolute: 0.1 K/uL (ref 0.0–0.7)
Eosinophils Relative: 1 % (ref 0.0–5.0)
HCT: 43.3 % (ref 39.0–52.0)
Hemoglobin: 14.4 g/dL (ref 13.0–17.0)
Lymphocytes Relative: 51.9 % — ABNORMAL HIGH (ref 12.0–46.0)
Lymphs Abs: 3.4 K/uL (ref 0.7–4.0)
MCHC: 33.4 g/dL (ref 30.0–36.0)
MCV: 93.1 fl (ref 78.0–100.0)
Monocytes Absolute: 0.4 K/uL (ref 0.1–1.0)
Monocytes Relative: 6.6 % (ref 3.0–12.0)
Neutro Abs: 2.6 K/uL (ref 1.4–7.7)
Neutrophils Relative %: 40.2 % — ABNORMAL LOW (ref 43.0–77.0)
Platelets: 198 K/uL (ref 150.0–400.0)
RBC: 4.65 Mil/uL (ref 4.22–5.81)
RDW: 13.7 % (ref 11.5–15.5)
WBC: 6.6 K/uL (ref 4.0–10.5)

## 2023-12-20 LAB — COMPREHENSIVE METABOLIC PANEL WITH GFR
ALT: 18 U/L (ref 0–53)
AST: 27 U/L (ref 0–37)
Albumin: 4.4 g/dL (ref 3.5–5.2)
Alkaline Phosphatase: 51 U/L (ref 39–117)
BUN: 21 mg/dL (ref 6–23)
CO2: 24 meq/L (ref 19–32)
Calcium: 9.1 mg/dL (ref 8.4–10.5)
Chloride: 104 meq/L (ref 96–112)
Creatinine, Ser: 0.99 mg/dL (ref 0.40–1.50)
GFR: 73.98 mL/min (ref >60.00)
Glucose, Bld: 109 mg/dL — ABNORMAL HIGH (ref 70–99)
Potassium: 4.2 meq/L (ref 3.5–5.1)
Sodium: 136 meq/L (ref 135–145)
Total Bilirubin: 1 mg/dL (ref 0.2–1.2)
Total Protein: 7 g/dL (ref 6.0–8.3)

## 2023-12-20 LAB — HEMOGLOBIN A1C: Hgb A1c MFr Bld: 6.4 % (ref 4.6–6.5)

## 2023-12-20 LAB — PSA, MEDICARE: PSA: 0.97 ng/mL (ref 0.10–4.00)

## 2023-12-20 LAB — TSH: TSH: 1.96 u[IU]/mL (ref 0.35–5.50)

## 2023-12-20 LAB — LIPID PANEL
Cholesterol: 94 mg/dL (ref 0–200)
HDL: 45.1 mg/dL (ref >39.00)
LDL Cholesterol: 33 mg/dL (ref 0–99)
NonHDL: 48.82
Total CHOL/HDL Ratio: 2
Triglycerides: 79 mg/dL (ref 0.0–149.0)
VLDL: 15.8 mg/dL (ref 0.0–40.0)

## 2023-12-20 LAB — URIC ACID: Uric Acid, Serum: 6.4 mg/dL (ref 4.0–7.8)

## 2023-12-21 ENCOUNTER — Ambulatory Visit: Admitting: Family Medicine

## 2023-12-21 ENCOUNTER — Encounter: Payer: Self-pay | Admitting: Family Medicine

## 2023-12-21 VITALS — BP 122/66 | HR 79 | Temp 97.8°F | Ht 71.0 in | Wt 248.0 lb

## 2023-12-21 DIAGNOSIS — I10 Essential (primary) hypertension: Secondary | ICD-10-CM

## 2023-12-21 DIAGNOSIS — E669 Obesity, unspecified: Secondary | ICD-10-CM

## 2023-12-21 DIAGNOSIS — Z6834 Body mass index (BMI) 34.0-34.9, adult: Secondary | ICD-10-CM

## 2023-12-21 DIAGNOSIS — Z23 Encounter for immunization: Secondary | ICD-10-CM

## 2023-12-21 DIAGNOSIS — M109 Gout, unspecified: Secondary | ICD-10-CM

## 2023-12-21 DIAGNOSIS — I7121 Aneurysm of the ascending aorta, without rupture: Secondary | ICD-10-CM

## 2023-12-21 DIAGNOSIS — I2510 Atherosclerotic heart disease of native coronary artery without angina pectoris: Secondary | ICD-10-CM

## 2023-12-21 DIAGNOSIS — E1129 Type 2 diabetes mellitus with other diabetic kidney complication: Secondary | ICD-10-CM | POA: Diagnosis not present

## 2023-12-21 DIAGNOSIS — E782 Mixed hyperlipidemia: Secondary | ICD-10-CM

## 2023-12-21 NOTE — Progress Notes (Signed)
 Subjective:     Patient ID: Sean Cabrera, male    DOB: February 01, 1946, 78 y.o.   MRN: [id number]  Chief Complaint  Patient presents with   Medical Management of Chronic Issues    6 month f/u    HPI  Discussed the use of AI scribe software for clinical note transcription with the patient, who gave verbal consent to proceed.  History of Present Illness Sean Cabrera is a 78 year old male with diabetes, hypertension, hyperlipidemia, coronary artery disease, chronic kidney disease, and gout who presents for follow-up on chronic health conditions.  Glycemic control - A1c increased to 6.4% from 6.1% in July - Adheres to a diet without sweets - No symptoms of hyperglycemia or hypoglycemia reported  Lipid management - LDL is 33 mg/dL - Managed with rosuvastatin  10 mg - Follows with cardiology   Hypertension and renal function - Blood pressure managed with valsartan  160 mg - Most recent GFR is 74 mL/min/1.90m2 - Increased hydration - Considered but did not start Jardiance  due to cost  Gout and musculoskeletal symptoms - Gout managed with allopurinol  - No recent gout flares - Stable uric acid levels - Experienced left foot swelling during the summer, resolved with cooler weather  Weight and physical activity - Weight decreased to 248 lbs from 251 lbs - Walks two hours daily   Lives in [place] 5 months out of the year     Health Maintenance Due  Topic Date Due   OPHTHALMOLOGY EXAM  Never done   Pneumococcal Vaccine: 50+ Years (2 of 2 - PCV) 04/04/2015   Medicare Annual Wellness (AWV)  12/15/2023    Past Medical History:  Diagnosis Date   Gout    High cholesterol    Hypertension    Sleep apnea    lost 30 lbs not used cpap since 2010   Type 2 diabetes mellitus without complication, without long-term current use of insulin (HCC) 12/31/2022    Past Surgical History:  Procedure Laterality Date   CYSTECTOMY     removed off left lung  benign   HERNIA REPAIR      INGUINAL HERNIA REPAIR Right 07/08/2016   Procedure: LAPAROSCOPIC RIGHT INGUINAL HERNIA REPAIR WITH MESH;  Surgeon: Lynda Leos, MD;  Location: [facility] OR;  Service: General;  Laterality: Right;   INSERTION OF MESH Right 07/08/2016   Procedure: INSERTION OF MESH;  Surgeon: Lynda Leos, MD;  Location: [facility] OR;  Service: General;  Laterality: Right;    Family History  Problem Relation Age of Onset   Hypertension Mother    Diabetes Father     Social History   Socioeconomic History   Marital status: Divorced    Spouse name: Not on file   Number of children: 2   Years of education: Not on file   Highest education level: Master's degree (e.g., MA, MS, MEng, MEd, MSW, MBA)  Occupational History   Not on file  Tobacco Use   Smoking status: Never   Smokeless tobacco: Never  Vaping Use   Vaping status: Never Used  Substance and Sexual Activity   Alcohol use: No   Drug use: No   Sexual activity: Not Currently    Partners: Female    Comment: Divorced  Other Topics Concern   Not on file  Social History Narrative   Not on file   Social Drivers of Health   Financial Resource Strain: Low Risk  (12/17/2023)   Overall Financial Resource Strain (CARDIA)    Difficulty of Paying  Living Expenses: Not very hard  Food Insecurity: No Food Insecurity (12/17/2023)   Hunger Vital Sign    Worried About Running Out of Food in the Last Year: Never true    Ran Out of Food in the Last Year: Never true  Transportation Needs: No Transportation Needs (12/17/2023)   PRAPARE - Administrator, Civil Service (Medical): No    Lack of Transportation (Non-Medical): No  Physical Activity: Sufficiently Active (12/17/2023)   Exercise Vital Sign    Days of Exercise per Week: 7 days    Minutes of Exercise per Session: 70 min  Stress: No Stress Concern Present (12/17/2023)   [employer] of Occupational Health - Occupational Stress Questionnaire    Feeling of Stress: Only a little   Social Connections: Moderately Integrated (12/17/2023)   Social Connection and Isolation Panel    Frequency of Communication with Friends and Family: More than three times a week    Frequency of Social Gatherings with Friends and Family: Once a week    Attends Religious Services: 1 to 4 times per year    Active Member of [employer] or Organizations: Yes    Attends Engineer, structural: More than 4 times per year    Marital Status: Divorced  Intimate Partner Violence: Not At Risk (12/15/2022)   Humiliation, Afraid, Rape, and Kick questionnaire    Fear of Current or Ex-Partner: No    Emotionally Abused: No    Physically Abused: No    Sexually Abused: No    Outpatient Medications Prior to Visit  Medication Sig Dispense Refill   allopurinol  (ZYLOPRIM ) 300 MG tablet TAKE 1 TABLET BY MOUTH EVERY DAY 90 tablet 1   aspirin  EC 81 MG tablet Take 1 tablet (81 mg total) by mouth daily. Swallow whole. 30 tablet 12   colchicine  0.6 MG tablet TAKE 1 TABLET BY MOUTH TWICE A DAY FOR GOUT FLARE 180 tablet 0   rosuvastatin  (CRESTOR ) 20 MG tablet Take 1 tablet (20 mg total) by mouth daily. 90 tablet 3   sildenafil  (VIAGRA ) 50 MG tablet TAKE 1-2 TABLETS BY MOUTH DAILY AS NEEDED FOR ERECTILE DYSFUNCTION (NO MORE THAN 100 MG/24 HOURS). 30 tablet 0   valsartan  (DIOVAN ) 160 MG tablet TAKE 1 TABLET BY MOUTH EVERY DAY 90 tablet 3   empagliflozin  (JARDIANCE ) 10 MG TABS tablet Take 1 tablet (10 mg total) by mouth daily before breakfast. 90 tablet 1   No facility-administered medications prior to visit.    No Known Allergies  Review of Systems  Constitutional:  Positive for weight loss. Negative for chills, fever and malaise/fatigue.  Respiratory:  Negative for shortness of breath.   Cardiovascular:  Negative for chest pain, palpitations and leg swelling.  Gastrointestinal:  Negative for abdominal pain, constipation, diarrhea, nausea and vomiting.  Genitourinary:  Negative for dysuria, frequency and  urgency.  Musculoskeletal:  Negative for joint pain and myalgias.  Neurological:  Negative for dizziness, focal weakness and headaches.  Psychiatric/Behavioral:  Negative for depression. The patient is not nervous/anxious.        Objective:    Physical Exam Constitutional:      General: He is not in acute distress.    Appearance: He is not ill-appearing.  HENT:     Mouth/Throat:     Mouth: Mucous membranes are moist.     Pharynx: Oropharynx is clear.  Eyes:     Extraocular Movements: Extraocular movements intact.     Conjunctiva/sclera: Conjunctivae normal.     Pupils:  Pupils are equal, round, and reactive to light.  Neck:     Vascular: No carotid bruit.  Cardiovascular:     Rate and Rhythm: Normal rate and regular rhythm.  Pulmonary:     Effort: Pulmonary effort is normal.     Breath sounds: Normal breath sounds.  Musculoskeletal:     Cervical back: Normal range of motion and neck supple. No tenderness.     Right lower leg: No edema.     Left lower leg: No edema.  Lymphadenopathy:     Cervical: No cervical adenopathy.  Skin:    General: Skin is warm and dry.  Neurological:     General: No focal deficit present.     Mental Status: He is alert and oriented to person, place, and time.     Motor: No weakness.     Coordination: Coordination normal.     Gait: Gait normal.  Psychiatric:        Mood and Affect: Mood normal.        Behavior: Behavior normal.        Thought Content: Thought content normal.      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 No significant change from previous A1c of 6.1. No additional medication for kidney protection due to cost concerns and stable condition. - Continue current diabetes management regimen - Discontinue Jardiance  due to cost and lack of coverage  Hypertension Hypertension is well-managed with valsartan , which also supports kidney function. - Continue valsartan  160 mg daily  Hyperlipidemia Hyperlipidemia is well-controlled with an LDL of 33, within the goal range. Rosuvastatin  effectively manages cholesterol levels. - Continue rosuvastatin  10 mg daily  Chronic kidney disease, stage 2 Chronic kidney disease is stable with improved kidney function. GFR is 74, indicating good kidney function. BUN is within normal range. Increased hydration has contributed to improvement. - Continue current management and hydration regimen  Gout Gout is well-controlled with no recent flares. Uric acid levels are within normal range, and allopurinol  is effective. - Continue allopurinol  300 mg daily  Obesity Obesity is being addressed with weight loss efforts. Recent weight loss from 251 lbs to 248 lbs indicates progress. - Encourage continued weight loss efforts and physical activity  Coronary artery disease Coronary artery disease is monitored by a cardiologist. LDL levels are within goal range to manage coronary calcification. Follow-up with cardiologist is scheduled before next travel. - Continue current management  and follow-up with cardiologist     I have discontinued Anders  Terhune's empagliflozin . I am also having him maintain his aspirin  EC, rosuvastatin , valsartan , sildenafil , colchicine , and allopurinol .  No orders of the defined types were placed in this encounter.

## 2023-12-21 NOTE — Patient Instructions (Addendum)
 Keep up the good work!  Remember to get the Covid booster and a dilated eye exam.

## 2023-12-23 ENCOUNTER — Ambulatory Visit

## 2023-12-30 ENCOUNTER — Other Ambulatory Visit: Payer: Self-pay | Admitting: Cardiology

## 2024-02-23 ENCOUNTER — Ambulatory Visit

## 2024-03-16 ENCOUNTER — Other Ambulatory Visit: Payer: Self-pay | Admitting: Cardiology

## 2024-03-16 ENCOUNTER — Other Ambulatory Visit: Payer: Self-pay | Admitting: Family Medicine

## 2024-06-20 ENCOUNTER — Ambulatory Visit: Admitting: Family Medicine

## 2024-06-20 ENCOUNTER — Ambulatory Visit: Admitting: Cardiology

## 2024-06-22 ENCOUNTER — Ambulatory Visit: Admitting: Family Medicine

## 2024-06-22 ENCOUNTER — Ambulatory Visit
